# Patient Record
Sex: Female | Born: 1950 | Race: White | Hispanic: No | Marital: Married | Smoking: Never smoker
Health system: Southern US, Community
[De-identification: ages and names within clinical notes are randomized; demographics above are authoritative.]

## PROBLEM LIST (undated history)

## (undated) DIAGNOSIS — K5790 Diverticulosis of intestine, part unspecified, without perforation or abscess without bleeding: Secondary | ICD-10-CM

## (undated) DIAGNOSIS — I341 Nonrheumatic mitral (valve) prolapse: Secondary | ICD-10-CM

## (undated) DIAGNOSIS — R0989 Other specified symptoms and signs involving the circulatory and respiratory systems: Secondary | ICD-10-CM

## (undated) DIAGNOSIS — M810 Age-related osteoporosis without current pathological fracture: Secondary | ICD-10-CM

## (undated) DIAGNOSIS — N200 Calculus of kidney: Secondary | ICD-10-CM

## (undated) DIAGNOSIS — G4733 Obstructive sleep apnea (adult) (pediatric): Secondary | ICD-10-CM

## (undated) DIAGNOSIS — K589 Irritable bowel syndrome without diarrhea: Secondary | ICD-10-CM

## (undated) DIAGNOSIS — R0789 Other chest pain: Secondary | ICD-10-CM

## (undated) DIAGNOSIS — C55 Malignant neoplasm of uterus, part unspecified: Secondary | ICD-10-CM

## (undated) DIAGNOSIS — F419 Anxiety disorder, unspecified: Secondary | ICD-10-CM

## (undated) DIAGNOSIS — D72820 Lymphocytosis (symptomatic): Secondary | ICD-10-CM

## (undated) DIAGNOSIS — D649 Anemia, unspecified: Secondary | ICD-10-CM

## (undated) DIAGNOSIS — R112 Nausea with vomiting, unspecified: Secondary | ICD-10-CM

## (undated) DIAGNOSIS — N189 Chronic kidney disease, unspecified: Secondary | ICD-10-CM

## (undated) DIAGNOSIS — Z9889 Other specified postprocedural states: Secondary | ICD-10-CM

## (undated) DIAGNOSIS — E039 Hypothyroidism, unspecified: Secondary | ICD-10-CM

## (undated) DIAGNOSIS — E785 Hyperlipidemia, unspecified: Secondary | ICD-10-CM

## (undated) DIAGNOSIS — I38 Endocarditis, valve unspecified: Secondary | ICD-10-CM

## (undated) DIAGNOSIS — I10 Essential (primary) hypertension: Secondary | ICD-10-CM

## (undated) DIAGNOSIS — D369 Benign neoplasm, unspecified site: Secondary | ICD-10-CM

## (undated) DIAGNOSIS — Z8616 Personal history of COVID-19: Secondary | ICD-10-CM

## (undated) DIAGNOSIS — K649 Unspecified hemorrhoids: Secondary | ICD-10-CM

## (undated) DIAGNOSIS — C801 Malignant (primary) neoplasm, unspecified: Secondary | ICD-10-CM

## (undated) HISTORY — DX: Obstructive sleep apnea (adult) (pediatric): G47.33

## (undated) HISTORY — DX: Lymphocytosis (symptomatic): D72.820

## (undated) HISTORY — DX: Essential (primary) hypertension: I10

## (undated) HISTORY — PX: BREAST CYST ASPIRATION: SHX578

## (undated) HISTORY — PX: KNEE SURGERY: SHX244

## (undated) HISTORY — DX: Age-related osteoporosis without current pathological fracture: M81.0

## (undated) HISTORY — PX: THYROID SURGERY: SHX805

## (undated) HISTORY — DX: Nonrheumatic mitral (valve) prolapse: I34.1

## (undated) HISTORY — PX: JOINT REPLACEMENT: SHX530

## (undated) HISTORY — DX: Personal history of COVID-19: Z86.16

## (undated) HISTORY — PX: ABDOMINAL HYSTERECTOMY: SHX81

---

## 2005-02-02 ENCOUNTER — Emergency Department: Payer: Self-pay | Admitting: Emergency Medicine

## 2005-02-09 ENCOUNTER — Emergency Department: Payer: Self-pay | Admitting: Emergency Medicine

## 2005-02-20 ENCOUNTER — Ambulatory Visit: Payer: Self-pay | Admitting: Internal Medicine

## 2005-02-26 ENCOUNTER — Ambulatory Visit: Payer: Self-pay | Admitting: Internal Medicine

## 2005-03-18 ENCOUNTER — Ambulatory Visit: Payer: Self-pay | Admitting: Unknown Physician Specialty

## 2005-03-31 ENCOUNTER — Ambulatory Visit: Payer: Self-pay | Admitting: Unknown Physician Specialty

## 2005-08-14 ENCOUNTER — Ambulatory Visit: Payer: Self-pay | Admitting: Unknown Physician Specialty

## 2005-08-24 ENCOUNTER — Emergency Department: Payer: Self-pay | Admitting: Internal Medicine

## 2006-04-27 ENCOUNTER — Ambulatory Visit: Payer: Self-pay

## 2006-09-01 ENCOUNTER — Ambulatory Visit: Payer: Self-pay | Admitting: Unknown Physician Specialty

## 2007-10-26 ENCOUNTER — Ambulatory Visit: Payer: Self-pay | Admitting: Unknown Physician Specialty

## 2008-01-03 ENCOUNTER — Ambulatory Visit: Payer: Self-pay | Admitting: General Practice

## 2008-01-06 ENCOUNTER — Other Ambulatory Visit: Payer: Self-pay

## 2008-01-06 ENCOUNTER — Ambulatory Visit: Payer: Self-pay | Admitting: General Practice

## 2008-01-17 ENCOUNTER — Inpatient Hospital Stay: Payer: Self-pay | Admitting: General Practice

## 2008-11-01 ENCOUNTER — Ambulatory Visit: Payer: Self-pay | Admitting: Unknown Physician Specialty

## 2009-11-14 ENCOUNTER — Ambulatory Visit: Payer: Self-pay | Admitting: Unknown Physician Specialty

## 2010-11-19 ENCOUNTER — Ambulatory Visit: Payer: Self-pay | Admitting: Internal Medicine

## 2010-12-19 ENCOUNTER — Ambulatory Visit: Payer: Self-pay | Admitting: Unknown Physician Specialty

## 2011-02-13 ENCOUNTER — Ambulatory Visit: Payer: Self-pay | Admitting: General Practice

## 2011-04-07 ENCOUNTER — Inpatient Hospital Stay: Payer: Self-pay | Admitting: General Practice

## 2012-02-12 ENCOUNTER — Ambulatory Visit: Payer: Self-pay | Admitting: Unknown Physician Specialty

## 2012-06-24 ENCOUNTER — Ambulatory Visit: Payer: Self-pay | Admitting: Unknown Physician Specialty

## 2014-04-29 ENCOUNTER — Emergency Department: Payer: Self-pay | Admitting: Emergency Medicine

## 2014-05-22 DIAGNOSIS — S62629A Displaced fracture of medial phalanx of unspecified finger, initial encounter for closed fracture: Secondary | ICD-10-CM | POA: Insufficient documentation

## 2014-07-18 DIAGNOSIS — M1990 Unspecified osteoarthritis, unspecified site: Secondary | ICD-10-CM | POA: Insufficient documentation

## 2014-07-18 DIAGNOSIS — M810 Age-related osteoporosis without current pathological fracture: Secondary | ICD-10-CM | POA: Insufficient documentation

## 2015-04-06 DIAGNOSIS — M5137 Other intervertebral disc degeneration, lumbosacral region: Secondary | ICD-10-CM | POA: Insufficient documentation

## 2015-06-22 DIAGNOSIS — R0989 Other specified symptoms and signs involving the circulatory and respiratory systems: Secondary | ICD-10-CM | POA: Insufficient documentation

## 2015-06-22 DIAGNOSIS — I341 Nonrheumatic mitral (valve) prolapse: Secondary | ICD-10-CM | POA: Insufficient documentation

## 2015-07-26 DIAGNOSIS — E785 Hyperlipidemia, unspecified: Secondary | ICD-10-CM | POA: Insufficient documentation

## 2015-10-09 ENCOUNTER — Other Ambulatory Visit: Payer: Self-pay | Admitting: Unknown Physician Specialty

## 2015-10-09 DIAGNOSIS — Z13820 Encounter for screening for osteoporosis: Secondary | ICD-10-CM

## 2015-10-22 ENCOUNTER — Ambulatory Visit
Admission: RE | Admit: 2015-10-22 | Discharge: 2015-10-22 | Disposition: A | Discharge status: Home / Self Care | Payer: Medicare Other | Source: Ambulatory Visit | Attending: Unknown Physician Specialty | Admitting: Unknown Physician Specialty

## 2015-10-22 DIAGNOSIS — Z13820 Encounter for screening for osteoporosis: Secondary | ICD-10-CM | POA: Diagnosis not present

## 2015-10-22 DIAGNOSIS — Z780 Asymptomatic menopausal state: Secondary | ICD-10-CM | POA: Insufficient documentation

## 2017-05-20 ENCOUNTER — Encounter: Payer: Self-pay | Admitting: Obstetrics & Gynecology

## 2017-05-20 ENCOUNTER — Ambulatory Visit (INDEPENDENT_AMBULATORY_CARE_PROVIDER_SITE_OTHER): Payer: Medicare Other | Admitting: Obstetrics & Gynecology

## 2017-05-20 VITALS — BP 110/60 | HR 79 | Ht 61.0 in | Wt 165.0 lb

## 2017-05-20 DIAGNOSIS — Z0000 Encounter for general adult medical examination without abnormal findings: Secondary | ICD-10-CM

## 2017-05-20 DIAGNOSIS — Z1211 Encounter for screening for malignant neoplasm of colon: Secondary | ICD-10-CM

## 2017-05-20 DIAGNOSIS — Z01419 Encounter for gynecological examination (general) (routine) without abnormal findings: Secondary | ICD-10-CM | POA: Diagnosis not present

## 2017-05-20 DIAGNOSIS — Z1239 Encounter for other screening for malignant neoplasm of breast: Secondary | ICD-10-CM

## 2017-05-20 DIAGNOSIS — M810 Age-related osteoporosis without current pathological fracture: Secondary | ICD-10-CM

## 2017-05-20 DIAGNOSIS — N393 Stress incontinence (female) (male): Secondary | ICD-10-CM | POA: Diagnosis not present

## 2017-05-20 DIAGNOSIS — Z1231 Encounter for screening mammogram for malignant neoplasm of breast: Secondary | ICD-10-CM

## 2017-05-20 MED ORDER — RALOXIFENE HCL 60 MG PO TABS: 60.0000 mg | ORAL_TABLET | Freq: Every day | ORAL | 12 refills | Status: DC

## 2017-05-20 NOTE — Progress Notes (Signed)
  HPI:      [name] is a 66 y.o. [id number] who LMP was in the past, she presents today for her annual examination.  The patient has no complaints today. The patient is not sexually active. Herlast pap: approximate date 2017 and was normal and last mammogram: approximate date 2017 and was normal.  The patient does perform self breast exams.  There is no notable family history of breast or ovarian cancer in her family. The patient is not taking hormone replacement therapy. Patient denies post-menopausal vaginal bleeding.   The patient has regular exercise: yes. The patient denies current symptoms of depression.  GSI esp at night, no urge or freq.  GYN Hx: Last Colonoscopy:4 years ago. Normal.  Last DEXA: 2 years ago.    PMHx: History reviewed. No pertinent past medical history. Past Surgical History:  Procedure Laterality Date  . ABDOMINAL HYSTERECTOMY    . KNEE SURGERY    . THYROID SURGERY     Family History  Problem Relation Age of Onset  . Skin cancer Mother   . Cancer Maternal Grandfather    Social History  Substance Use Topics  . Smoking status: Never Smoker  . Smokeless tobacco: Never Used  . Alcohol use No    Current Outpatient Prescriptions:  .  raloxifene (EVISTA) 60 MG tablet, Take 1 tablet (60 mg total) by mouth daily., Disp: 30 tablet, Rfl: 12 Allergies: Bisphosphonates and Celecoxib  ROS  Objective: BP 110/60   Pulse 79   Ht 5\' 1"  (1.549 m)   Wt 165 lb (74.8 kg)   BMI 31.18 kg/m   Filed Weights   05/20/17 1329  Weight: 165 lb (74.8 kg)   Body mass index is 31.18 kg/m. OBGyn Exam  Assessment: Annual Exam 1. Annual physical exam   2. Screening for breast cancer   3. Screen for colon cancer   4. Stress incontinence of urine   5. Age-related osteoporosis without current pathological fracture     Plan:            1.  Cervical Screening-  Pap smear schedule reviewed with patient  2. Breast screening- Exam annually and mammogram  scheduled  3. Colonoscopy every 10 years, Hemoccult testing after age 22  4. Labs managed by PCP  5. Counseling for hormonal therapy: none.  Start Evista for OP after all options discussed.  Tried bisphos in past w SE.  Does not want hormones.   6. Stress incontinence of urine Monitor for now   7. Age-related osteoporosis without current pathological fracture - raloxifene (EVISTA) 60 MG tablet; Take 1 tablet (60 mg total) by mouth daily.  Dispense: 30 tablet; Refill: 12 - also vit D and calcium as she has been doing    F/U  Return in about 1 year (around 05/20/2018) for Annual.  Barnett Applebaum, MD, [facility], [facility] 05/20/2017  1:52 PM

## 2017-05-20 NOTE — Patient Instructions (Addendum)
PAP every three years Mammogram every year.  [place] [facility] [phone]. Colonoscopy every 10 years Labs yearly (with PCP) Bone scan after effective therapy   Raloxifene tablets What is this medicine? RALOXIFENE (ral OX i feen) reduces the amount of calcium lost from bones. It is used to treat and prevent osteoporosis in women who have experienced menopause. It may also help prevent invasive breast cancer in certain women who have a high risk for breast cancer. This medicine may be used for other purposes; ask your health care provider or pharmacist if you have questions. COMMON BRAND NAME(S): Evista What should I tell my health care provider before I take this medicine? They need to know if you have any of these conditions: -a history of blood clots -cancer -heart disease or recent heart attack -high levels of triglycerides (blood fat) in the blood -history of stroke -kidney disease -liver disease -premenopausal -smoke tobacco -an unusual or allergic reaction to raloxifene, other medicines, foods, dyes, or preservatives -pregnant or trying to get pregnant -breast-feeding How should I use this medicine? Take this medicine by mouth with a glass of water. Follow the directions on the prescription label. The tablets can be taken with or without food. Take your doses at regular intervals. Do not take your medicine more often than directed. A special MedGuide will be given to you by the pharmacist with each prescription and refill. Be sure to read this information carefully each time. Talk to your pediatrician regarding the use of this medicine in children. Special care may be needed. Overdosage: If you think you have taken too much of this medicine contact a poison control center or emergency room at once. NOTE: This medicine is only for you. Do not share this medicine with others. What if I miss a dose? If you miss a dose, take it as soon as you can. If it is almost time for your next dose, take  only that dose. Do not take double or extra doses. What may interact with this medicine? -cholestyramine -female hormones, like estrogens -warfarin This list may not describe all possible interactions. Give your health care provider a list of all the medicines, herbs, non-prescription drugs, or dietary supplements you use. Also tell them if you smoke, drink alcohol, or use illegal drugs. Some items may interact with your medicine. What should I watch for while using this medicine? Visit your doctor or health care professional for regular checks on your progress. Do not stop taking this medicine except on the advice of your doctor or health care professional. If you are taking this medicine to reduce your risk of getting breast cancer, you should know that this medicine does not prevent all types of breast cancer. Talk to your doctor if you have questions. This medicine does not prevent hot flashes. It may cause hot flashes in some patients at the start of therapy. You should make sure that you get enough calcium and vitamin D while you are taking this medicine. Discuss the foods you eat and the vitamins you take with your health care professional. Exercise may help to prevent bone loss. Discuss your exercise needs with your doctor or health care professional. This medicine can rarely cause blood clots. If you are going to have surgery, tell your doctor or health care professional that you are taking this medicine. This medicine should be stopped at least 3 days before surgery. After surgery, it should be restarted only after you are walking again. It should not be restarted while you  still need long periods of bed rest. You should not smoke while taking this medicine. Smoking may increase your risk of blood clots or stroke. If you have any reason to think you are pregnant; stop taking this medicine at once and contact your doctor or health care professional. Do not breast feed while taking this  medicine. What side effects may I notice from receiving this medicine? Side effects that you should report to your doctor or health care professional as soon as possible: -allergic reactions like skin rash, itching or hives, swelling of the face, lips, or tongue) -breast tissue changes or discharge -signs and symptoms of a blood clot such as breathing problems; changes in vision; chest pain; severe, sudden headache; pain, swelling, warmth in the leg; trouble speaking; sudden numbness or weakness of the face, arm or leg -signs and symptoms of a stroke like changes in vision; confusion; trouble speaking or understanding; severe headaches; sudden numbness or weakness of the face, arm or leg; trouble walking; dizziness; loss of balance or coordination -vaginal discharge that is bloody, brown, or rust Side effects that usually do not require medical attention (report to your doctor or health care professional if they continue or are bothersome): -hot flashes -joint pain -leg cramps -sweating -swelling of the ankles, feet, hands This list may not describe all possible side effects. Call your doctor for medical advice about side effects. You may report side effects to FDA at 1-800-FDA-1088. Where should I keep my medicine? Keep out of the reach of children. Store at room temperature between 15 and 30 degrees C (59 and 86 degrees F). Throw away any unused medicine after the expiration date. NOTE: This sheet is a summary. It may not cover all possible information. If you have questions about this medicine, talk to your doctor, pharmacist, or health care provider.  2018 Elsevier/Gold Standard (2017-01-07 17:15:34)

## 2017-07-01 ENCOUNTER — Ambulatory Visit
Admission: RE | Admit: 2017-07-01 | Discharge: 2017-07-01 | Disposition: A | Discharge status: Home / Self Care | Payer: Medicare Other | Source: Ambulatory Visit | Attending: Obstetrics & Gynecology | Admitting: Obstetrics & Gynecology

## 2017-07-01 DIAGNOSIS — N6489 Other specified disorders of breast: Secondary | ICD-10-CM | POA: Diagnosis not present

## 2017-07-01 DIAGNOSIS — Z1231 Encounter for screening mammogram for malignant neoplasm of breast: Secondary | ICD-10-CM | POA: Insufficient documentation

## 2017-07-01 DIAGNOSIS — R928 Other abnormal and inconclusive findings on diagnostic imaging of breast: Secondary | ICD-10-CM | POA: Diagnosis not present

## 2017-07-01 DIAGNOSIS — Z1239 Encounter for other screening for malignant neoplasm of breast: Secondary | ICD-10-CM

## 2017-07-03 ENCOUNTER — Inpatient Hospital Stay
Admission: RE | Admit: 2017-07-03 | Discharge: 2017-07-03 | Disposition: A | Discharge status: Home / Self Care | Payer: Self-pay | Source: Ambulatory Visit | Attending: *Deleted | Admitting: *Deleted

## 2017-07-03 ENCOUNTER — Other Ambulatory Visit: Payer: Self-pay | Admitting: *Deleted

## 2017-07-03 ENCOUNTER — Other Ambulatory Visit: Payer: Self-pay | Admitting: Obstetrics & Gynecology

## 2017-07-03 DIAGNOSIS — R928 Other abnormal and inconclusive findings on diagnostic imaging of breast: Secondary | ICD-10-CM

## 2017-07-03 DIAGNOSIS — Z9289 Personal history of other medical treatment: Secondary | ICD-10-CM

## 2017-07-07 ENCOUNTER — Encounter: Payer: Self-pay | Admitting: Obstetrics & Gynecology

## 2017-07-10 ENCOUNTER — Ambulatory Visit
Admission: RE | Admit: 2017-07-10 | Discharge: 2017-07-10 | Disposition: A | Discharge status: Home / Self Care | Payer: Medicare Other | Source: Ambulatory Visit | Attending: Obstetrics & Gynecology | Admitting: Obstetrics & Gynecology

## 2017-07-10 DIAGNOSIS — R928 Other abnormal and inconclusive findings on diagnostic imaging of breast: Secondary | ICD-10-CM

## 2018-02-20 DIAGNOSIS — I5189 Other ill-defined heart diseases: Secondary | ICD-10-CM

## 2018-02-20 HISTORY — DX: Other ill-defined heart diseases: I51.89

## 2018-08-04 DIAGNOSIS — R0789 Other chest pain: Secondary | ICD-10-CM | POA: Insufficient documentation

## 2019-05-20 ENCOUNTER — Ambulatory Visit: Payer: Medicare Other | Admitting: Obstetrics & Gynecology

## 2019-05-30 ENCOUNTER — Ambulatory Visit: Payer: Medicare Other | Admitting: Obstetrics & Gynecology

## 2019-07-12 ENCOUNTER — Other Ambulatory Visit: Payer: Self-pay | Admitting: Internal Medicine

## 2019-07-12 DIAGNOSIS — Z1231 Encounter for screening mammogram for malignant neoplasm of breast: Secondary | ICD-10-CM

## 2019-07-14 ENCOUNTER — Ambulatory Visit: Payer: Medicare Other | Admitting: Obstetrics & Gynecology

## 2019-08-15 ENCOUNTER — Ambulatory Visit
Admission: RE | Admit: 2019-08-15 | Discharge: 2019-08-15 | Disposition: A | Discharge status: Home / Self Care | Payer: Medicare Other | Source: Ambulatory Visit | Attending: Internal Medicine | Admitting: Internal Medicine

## 2019-08-15 DIAGNOSIS — Z1231 Encounter for screening mammogram for malignant neoplasm of breast: Secondary | ICD-10-CM | POA: Diagnosis not present

## 2019-08-31 DIAGNOSIS — G4733 Obstructive sleep apnea (adult) (pediatric): Secondary | ICD-10-CM | POA: Insufficient documentation

## 2019-09-22 ENCOUNTER — Other Ambulatory Visit: Payer: Self-pay

## 2019-09-22 ENCOUNTER — Emergency Department: Payer: Medicare Other

## 2019-09-22 ENCOUNTER — Emergency Department
Admission: EM | Admit: 2019-09-22 | Discharge: 2019-09-22 | Disposition: A | Discharge status: Home / Self Care | Payer: Medicare Other | Attending: Emergency Medicine | Admitting: Emergency Medicine

## 2019-09-22 DIAGNOSIS — S42252A Displaced fracture of greater tuberosity of left humerus, initial encounter for closed fracture: Secondary | ICD-10-CM | POA: Insufficient documentation

## 2019-09-22 DIAGNOSIS — Y999 Unspecified external cause status: Secondary | ICD-10-CM | POA: Diagnosis not present

## 2019-09-22 DIAGNOSIS — Y939 Activity, unspecified: Secondary | ICD-10-CM | POA: Diagnosis not present

## 2019-09-22 DIAGNOSIS — Y929 Unspecified place or not applicable: Secondary | ICD-10-CM | POA: Insufficient documentation

## 2019-09-22 DIAGNOSIS — S4992XA Unspecified injury of left shoulder and upper arm, initial encounter: Secondary | ICD-10-CM | POA: Diagnosis present

## 2019-09-22 DIAGNOSIS — S4292XA Fracture of left shoulder girdle, part unspecified, initial encounter for closed fracture: Secondary | ICD-10-CM

## 2019-09-22 DIAGNOSIS — W010XXA Fall on same level from slipping, tripping and stumbling without subsequent striking against object, initial encounter: Secondary | ICD-10-CM | POA: Insufficient documentation

## 2019-09-22 DIAGNOSIS — Z79899 Other long term (current) drug therapy: Secondary | ICD-10-CM | POA: Diagnosis not present

## 2019-09-22 MED ORDER — ONDANSETRON HCL 4 MG/2ML IJ SOLN
4.0000 mg | Freq: Once | INTRAMUSCULAR | Status: AC
  Administered 2019-09-22: 4 mg via INTRAVENOUS
  Filled 2019-09-22: qty 2

## 2019-09-22 MED ORDER — MORPHINE SULFATE (PF) 4 MG/ML IV SOLN
4.0000 mg | Freq: Once | INTRAVENOUS | Status: AC
  Administered 2019-09-22: 10:00:00 4 mg via INTRAVENOUS
  Filled 2019-09-22: qty 1

## 2019-09-22 MED ORDER — OXYCODONE-ACETAMINOPHEN 5-325 MG PO TABS: 1.0000 | ORAL_TABLET | ORAL | 0 refills | Status: AC | PRN

## 2019-09-22 NOTE — ED Provider Notes (Signed)
[facility] Emergency Department Provider Note   ____________________________________________   First MD Initiated Contact with Patient 09/22/19 1003     (approximate)  I have reviewed the triage vital signs and the nursing notes.   HISTORY  Chief Complaint Shoulder Pain    HPI Denise [facility] is a 68 y.o. female who reports she tripped and fell landing on her left shoulder.  She did not hit anything else.  She did not pass out.  She did not hit her head.  She has no headache or neck pain or pain anywhere except for her left shoulder.         History reviewed. No pertinent past medical history. Past medical history significant for the leaky mitral and tricuspid valve. Patient Active Problem List   Diagnosis Date Noted  . Stress incontinence of urine 05/20/2017  . Age-related osteoporosis without current pathological fracture 05/20/2017    Past Surgical History:  Procedure Laterality Date  . ABDOMINAL HYSTERECTOMY    . KNEE SURGERY    . THYROID SURGERY      Prior to Admission medications   Medication Sig Start Date End Date Taking? Authorizing Provider  ALPRAZolam Duanne Moron) 0.5 MG tablet Take 0.5 mg by mouth 3 (three) times daily as needed for anxiety. 09/16/19  Yes [provider]  levothyroxine (SYNTHROID) 88 MCG tablet Take 88 mcg by mouth daily. Take on an empty stomach with a glass of water at least 30-60 minutes before breakfast 06/30/19  Yes [provider]  nitroGLYCERIN (NITROSTAT) 0.4 MG SL tablet Place 0.4 mg under the tongue as needed. 08/31/19 08/30/20 Yes [provider]  raloxifene (EVISTA) 60 MG tablet Take 1 tablet (60 mg total) by mouth daily. 05/20/17   Gae Dry, MD    Allergies Bisphosphonates, Celecoxib, and Nsaids  Family History  Problem Relation Age of Onset  . Skin cancer Mother   . Cancer Maternal Grandfather     Social History Social History   Tobacco Use  . Smoking status:  Never Smoker  . Smokeless tobacco: Never Used  Substance Use Topics  . Alcohol use: No  . Drug use: No    Review of Systems  Constitutional: No fever/chills Eyes: No visual changes. ENT: No sore throat. Cardiovascular: Denies chest pain. Respiratory: Denies shortness of breath. Gastrointestinal: No abdominal pain.  No nausea, no vomiting.  No diarrhea.  No constipation. Genitourinary: Negative for dysuria. Musculoskeletal: Negative for back pain. Skin: Negative for rash. Neurological: Negative for headaches, focal weakness   ____________________________________________   PHYSICAL EXAM:  VITAL SIGNS: ED Triage Vitals  Enc Vitals Group     BP      Pulse      Resp      Temp      Temp src      SpO2      Weight      Height      Head Circumference      Peak Flow      Pain Score      Pain Loc      Pain Edu?      Excl. in [place]?     Constitutional: Alert and oriented. Well appearing and in no acute distress. Eyes: Conjunctivae are normal.  Head: Atraumatic. Nose: No congestion/rhinnorhea. Mouth/Throat: Mucous membranes are moist.  Oropharynx non-erythematous. Neck: No stridor.  No cervical spine tenderness to palpation. Cardiovascular: Normal rate, regular rhythm. Grossly normal heart sounds.  I do not hear her heart  murmur.  Good peripheral circulation. Respiratory: Normal respiratory effort.  No retractions. Lungs CTAB. Gastrointestinal: Soft and nontender. No distention. No abdominal bruits. No CVA tenderness. Musculoskeletal: No lower extremity tenderness left shoulder is tender.  There is no other tenderness in the back around the clavicle or distal arm. Neurologic:  Normal speech and language. No gross focal neurologic deficits are appreciated. No gait instability. Skin:  Skin is warm, dry and intact. No rash noted. Psychiatric: Mood and affect are normal. Speech and behavior are normal.  ____________________________________________   LABS (all labs ordered are  listed, but only abnormal results are displayed)  Labs Reviewed - No data to display ____________________________________________  EKG   ____________________________________________  RADIOLOGY  ED MD interpretation: Unfortunately I am unable to open all the views of the x-ray on the new PACS viewer for some reason.  The views I can open and do not appear to show anything but the greater tuberosity fracture fragments.  The joint appears to be intact.  Official radiology report(s): Dg Shoulder Left  Result Date: 09/22/2019 CLINICAL DATA:  Fall, pain EXAM: LEFT SHOULDER - 2+ VIEW COMPARISON:  None. FINDINGS: There are mildly displaced fracture fragments of the greater tuberosity of the left humerus. The remainder of the proximal humerus appears intact. The glenohumeral joint is in anatomic apposition. The acromioclavicular joint is preserved. The partially imaged left chest is unremarkable. IMPRESSION: 1. There are mildly displaced fracture fragments of the greater tuberosity of the left humerus. 2.  The glenohumeral joint is in anatomic apposition. Electronically Signed   By: Eddie Candle M.D.   On: 09/22/2019 10:59    ____________________________________________   PROCEDURES  Procedure(s) performed (including Critical Care):  Procedures   ____________________________________________   INITIAL IMPRESSION / ASSESSMENT AND PLAN / ED COURSE  ALEYNAH BLUMBERG was evaluated in Emergency Department on 09/22/2019 for the symptoms described in the history of present illness. She was evaluated in the context of the global COVID-19 pandemic, which necessitated consideration that the patient might be at risk for infection with the SARS-CoV-2 virus that causes COVID-19. Institutional protocols and algorithms that pertain to the evaluation of patients at risk for COVID-19 are in a state of rapid change based on information released by regulatory bodies including the CDC and federal and state  organizations. These policies and algorithms were followed during the patient's care in the ED. Dr. Mack Guise reviewed the films and feels that, as I suspected, all we need to do is put her in a sling and he will follow her up in the office.             ____________________________________________   FINAL CLINICAL IMPRESSION(S) / ED DIAGNOSES  Final diagnoses:  Closed fracture of left shoulder, initial encounter     ED Discharge Orders    None       Note:  This document was prepared using Dragon voice recognition software and may include unintentional dictation errors.    Nena Polio, MD 09/22/19 1114

## 2019-09-22 NOTE — ED Triage Notes (Signed)
Pt arrives via EMS from home after having a mechanical fall- no LOC per pt- pt states the only thing bothering her is her left shoulder- states she is unable to raise it

## 2019-09-22 NOTE — Discharge Instructions (Addendum)
The x-ray shows that you broke part of the top of the humerus or the upper arm bone.  I spoke with the orthopedic surgeon, Dr. Mack Guise.  He wants to keep you in a sling and follow-up with you in the office.  This will give the inflammation from the fall chance to settle down.  Please give his office a call.  Let them know you were seen in the emergency room with a shoulder fracture and that Dr. Mack Guise wants to follow-up with you in the office.  They should be out of see you within about a week.  Wear the sling.  You can take it off at night.  Use the Percocet 1 pill 4 times a day as needed for pain.  Be careful the Percocet can make you constipated and can make you woozy.  Do not to fall.  Do not drive on the Percocet.  Return for any increasing pain numbness or other problems.

## 2019-09-22 NOTE — ED Notes (Signed)
E-sig pad unavailable at this time, d/c teaching provided and paperwork explained, pt and husband verbalize understanding of d/c teaching

## 2020-01-06 ENCOUNTER — Ambulatory Visit: Payer: Medicare Other | Attending: Specialist

## 2020-01-06 DIAGNOSIS — G4733 Obstructive sleep apnea (adult) (pediatric): Secondary | ICD-10-CM | POA: Insufficient documentation

## 2020-01-09 ENCOUNTER — Other Ambulatory Visit: Payer: Self-pay

## 2020-01-27 ENCOUNTER — Other Ambulatory Visit: Admission: RE | Admit: 2020-01-27 | Payer: Medicare Other | Source: Ambulatory Visit

## 2020-01-31 ENCOUNTER — Ambulatory Visit: Payer: Medicare Other

## 2020-02-07 ENCOUNTER — Ambulatory Visit: Payer: Medicare Other | Attending: Specialist

## 2020-02-07 DIAGNOSIS — G4733 Obstructive sleep apnea (adult) (pediatric): Secondary | ICD-10-CM | POA: Insufficient documentation

## 2020-02-08 ENCOUNTER — Other Ambulatory Visit: Payer: Self-pay

## 2020-07-05 ENCOUNTER — Other Ambulatory Visit: Payer: Self-pay | Admitting: Internal Medicine

## 2020-07-05 DIAGNOSIS — Z1231 Encounter for screening mammogram for malignant neoplasm of breast: Secondary | ICD-10-CM

## 2020-08-15 ENCOUNTER — Ambulatory Visit
Admission: RE | Admit: 2020-08-15 | Discharge: 2020-08-15 | Disposition: A | Discharge status: Home / Self Care | Payer: Medicare Other | Source: Ambulatory Visit | Attending: Internal Medicine | Admitting: Internal Medicine

## 2020-08-15 ENCOUNTER — Other Ambulatory Visit: Payer: Self-pay

## 2020-08-15 DIAGNOSIS — Z1231 Encounter for screening mammogram for malignant neoplasm of breast: Secondary | ICD-10-CM | POA: Diagnosis not present

## 2020-12-19 DIAGNOSIS — Z8616 Personal history of COVID-19: Secondary | ICD-10-CM

## 2020-12-19 HISTORY — DX: Personal history of COVID-19: Z86.16

## 2021-03-07 ENCOUNTER — Other Ambulatory Visit: Payer: Self-pay | Admitting: *Deleted

## 2021-03-07 ENCOUNTER — Encounter: Payer: Self-pay | Admitting: *Deleted

## 2021-03-08 ENCOUNTER — Encounter (INDEPENDENT_AMBULATORY_CARE_PROVIDER_SITE_OTHER): Payer: Self-pay

## 2021-03-08 ENCOUNTER — Inpatient Hospital Stay: Payer: Medicare Other

## 2021-03-08 ENCOUNTER — Inpatient Hospital Stay: Payer: Medicare Other | Attending: Internal Medicine | Admitting: Internal Medicine

## 2021-03-08 ENCOUNTER — Encounter: Payer: Self-pay | Admitting: Internal Medicine

## 2021-03-08 DIAGNOSIS — Z8616 Personal history of COVID-19: Secondary | ICD-10-CM | POA: Diagnosis not present

## 2021-03-08 DIAGNOSIS — G4733 Obstructive sleep apnea (adult) (pediatric): Secondary | ICD-10-CM | POA: Diagnosis not present

## 2021-03-08 DIAGNOSIS — Z79899 Other long term (current) drug therapy: Secondary | ICD-10-CM | POA: Insufficient documentation

## 2021-03-08 DIAGNOSIS — I10 Essential (primary) hypertension: Secondary | ICD-10-CM | POA: Insufficient documentation

## 2021-03-08 DIAGNOSIS — D72820 Lymphocytosis (symptomatic): Secondary | ICD-10-CM | POA: Insufficient documentation

## 2021-03-08 LAB — CBC WITH DIFFERENTIAL/PLATELET
Abs Immature Granulocytes: 0.01 10*3/uL (ref 0.00–0.07)
Basophils Absolute: 0.1 10*3/uL (ref 0.0–0.1)
Basophils Relative: 1 %
Eosinophils Absolute: 0.1 10*3/uL (ref 0.0–0.5)
Eosinophils Relative: 1 %
HCT: 43.7 % (ref 36.0–46.0)
Hemoglobin: 14.5 g/dL (ref 12.0–15.0)
Immature Granulocytes: 0 %
Lymphocytes Relative: 44 %
Lymphs Abs: 2.6 10*3/uL (ref 0.7–4.0)
MCH: 29.2 pg (ref 26.0–34.0)
MCHC: 33.2 g/dL (ref 30.0–36.0)
MCV: 87.9 fL (ref 80.0–100.0)
Monocytes Absolute: 0.5 10*3/uL (ref 0.1–1.0)
Monocytes Relative: 9 %
Neutro Abs: 2.6 10*3/uL (ref 1.7–7.7)
Neutrophils Relative %: 45 %
Platelets: 301 10*3/uL (ref 150–400)
RBC: 4.97 MIL/uL (ref 3.87–5.11)
RDW: 13.2 % (ref 11.5–15.5)
WBC: 5.8 10*3/uL (ref 4.0–10.5)
nRBC: 0 % (ref 0.0–0.2)

## 2021-03-08 LAB — TECHNOLOGIST SMEAR REVIEW: Tech Review: NORMAL

## 2021-03-08 LAB — LACTATE DEHYDROGENASE: LDH: 156 U/L (ref 98–192)

## 2021-03-08 NOTE — Progress Notes (Signed)
[facility] CONSULT NOTE  Patient Care Team: Tracie Harrier, MD as PCP - General (Internal Medicine)  CHIEF COMPLAINTS/PURPOSE OF CONSULTATION: Lymphocytosis  #  Oncology History   No history exists.     HISTORY OF PRESENTING ILLNESS:  Denise [facility] 70 y.o.  female with no prior history of malignancy has been referred to [place] for further evaluation recommendations for mild lymphocytosis.  Patient noted to have elevated lymphocyte count intermittently over the last 2 to 3 years.  Relative lymphocytosis; but no absolute lymphocytosis.  Normal hemoglobin normal white count.  Patient denies any loss of appetite.  Denies any nausea vomiting.  Complains of fatigue [she had been taking care of her parents].   Review of Systems  Constitutional: Positive for malaise/fatigue. Negative for chills, diaphoresis, fever and weight loss.  HENT: Negative for nosebleeds and sore throat.   Eyes: Negative for double vision.  Respiratory: Negative for cough, hemoptysis, sputum production, shortness of breath and wheezing.   Cardiovascular: Negative for chest pain, palpitations, orthopnea and leg swelling.  Gastrointestinal: Negative for abdominal pain, blood in stool, constipation, diarrhea, heartburn, melena, nausea and vomiting.  Genitourinary: Negative for dysuria, frequency and urgency.  Musculoskeletal: Positive for joint pain. Negative for back pain.  Skin: Negative.  Negative for itching and rash.  Neurological: Negative for dizziness, tingling, focal weakness, weakness and headaches.  Endo/Heme/Allergies: Does not bruise/bleed easily.  Psychiatric/Behavioral: Negative for depression. The patient is not nervous/anxious and does not have insomnia.      MEDICAL HISTORY:  Past Medical History:  Diagnosis Date  . History of COVID-19   . Lymphocytosis   . Mitral valve prolapse syndrome   . OSA on CPAP   . Osteoporosis   . Primary hypertension     SURGICAL HISTORY: Past  Surgical History:  Procedure Laterality Date  . ABDOMINAL HYSTERECTOMY    . BREAST CYST ASPIRATION     years ago  . KNEE SURGERY    . THYROID SURGERY      SOCIAL HISTORY: Social History   Socioeconomic History  . Marital status: Married    Spouse name: Not on file  . Number of children: Not on file  . Years of education: Not on file  . Highest education level: Not on file  Occupational History  . Not on file  Tobacco Use  . Smoking status: Never Smoker  . Smokeless tobacco: Never Used  Vaping Use  . Vaping Use: Never used  Substance and Sexual Activity  . Alcohol use: No  . Drug use: No  . Sexual activity: Never  Other Topics Concern  . Not on file  Social History Narrative   Retired Marine scientist.  No smoking, alcohol abuse.  She had been taking care of her parents.  Parents recently moved to assisted living   Social Determinants of Health   Financial Resource Strain: Not on file  Food Insecurity: Not on file  Transportation Needs: Not on file  Physical Activity: Not on file  Stress: Not on file  Social Connections: Not on file  Intimate Partner Violence: Not on file    FAMILY HISTORY: Family History  Problem Relation Age of Onset  . Skin cancer Mother   . Cancer Maternal Grandfather   . Breast cancer Neg Hx     ALLERGIES:  is allergic to amitriptyline, bisphosphonates, celecoxib, and nsaids.  MEDICATIONS:  Current Outpatient Medications  Medication Sig Dispense Refill  . ALPRAZolam (XANAX) 0.5 MG tablet Take 0.5 mg by mouth 3 (  three) times daily as needed for anxiety.    Marland Kitchen azelastine (ASTELIN) 0.1 % nasal spray     . Calcium Carbonate-Vitamin D 600-200 MG-UNIT TABS Take 1 tablet by mouth in the morning and at bedtime.    . clotrimazole-betamethasone (LOTRISONE) cream     . hydrochlorothiazide (HYDRODIURIL) 25 MG tablet     . levothyroxine (SYNTHROID) 88 MCG tablet Take 88 mcg by mouth daily. Take on an empty stomach with a glass of water at least 30-60 minutes  before breakfast    . nitroGLYCERIN (NITROSTAT) 0.4 MG SL tablet Place 0.4 mg under the tongue as needed.    Marland Kitchen telmisartan (MICARDIS) 40 MG tablet Take 1 tablet by mouth daily.    Marland Kitchen venlafaxine XR (EFFEXOR-XR) 37.5 MG 24 hr capsule Take 1 capsule by mouth daily.     No current facility-administered medications for this visit.      Marland Kitchen  PHYSICAL EXAMINATION: ECOG PERFORMANCE STATUS: 0 - Asymptomatic  Vitals:   03/08/21 1339  BP: (!) 144/88  Pulse: 80  Resp: 16  Temp: (!) 97.5 F (36.4 C)   Filed Weights   03/08/21 1339  Weight: 173 lb (78.5 kg)    Physical Exam Constitutional:      Comments: Patient is walk independently.  She is alone.  HENT:     Head: Normocephalic and atraumatic.     Mouth/Throat:     Pharynx: No oropharyngeal exudate.  Eyes:     Pupils: Pupils are equal, round, and reactive to light.  Cardiovascular:     Rate and Rhythm: Normal rate and regular rhythm.  Pulmonary:     Effort: Pulmonary effort is normal. No respiratory distress.     Breath sounds: Normal breath sounds. No wheezing.  Abdominal:     General: Bowel sounds are normal. There is no distension.     Palpations: Abdomen is soft. There is no mass.     Tenderness: There is no abdominal tenderness. There is no guarding or rebound.  Musculoskeletal:        General: No tenderness. Normal range of motion.     Cervical back: Normal range of motion and neck supple.  Skin:    General: Skin is warm.  Neurological:     Mental Status: She is alert and oriented to person, place, and time.  Psychiatric:        Mood and Affect: Affect normal.      LABORATORY DATA:  I have reviewed the data as listed Lab Results  Component Value Date   WBC 5.8 03/08/2021   HGB 14.5 03/08/2021   HCT 43.7 03/08/2021   MCV 87.9 03/08/2021   PLT 301 03/08/2021   No results for input(s): NA, K, CL, CO2, GLUCOSE, BUN, CREATININE, CALCIUM, GFRNONAA, GFRAA, PROT, ALBUMIN, AST, ALT, ALKPHOS, BILITOT, BILIDIR, IBILI  in the last 8760 hours.  RADIOGRAPHIC STUDIES: I have personally reviewed the radiological images as listed and agreed with the findings in the report. No results found.  ASSESSMENT & PLAN:   Lymphocytosis #Mild lymphocytosis-normal white count; slightly increased percentage of lymphocytes; although absolute lymphocyte count is normal.  Suspect reactive rather than lymphoproliferative disorder like CLL.  Check CBC' LDH; flow cytometry.  I do not think patient will need a bone marrow biopsy.  #Fatigue-question caregiver; I doubt if patient's fatigue is related to patient's mild lymphocytosis.  Thank you Dr.Hande for allowing me to participate in the care of your pleasant patient. Please do not hesitate to contact me with questions  or concerns in the interim.  # DISPOSITION: will call # labs today # follow up TBD-Dr.B    All questions were answered. The patient knows to call the clinic with any problems, questions or concerns.    Cammie Sickle, MD 03/08/2021 5:25 PM

## 2021-03-08 NOTE — Assessment & Plan Note (Addendum)
#  Mild lymphocytosis-normal white count; slightly increased percentage of lymphocytes; although absolute lymphocyte count is normal.  Suspect reactive rather than lymphoproliferative disorder like CLL.  Check CBC' LDH; flow cytometry.  I do not think patient will need a bone marrow biopsy.  #Fatigue-question caregiver; I doubt if patient's fatigue is related to patient's mild lymphocytosis.  Thank you Dr.Hande for allowing me to participate in the care of your pleasant patient. Please do not hesitate to contact me with questions or concerns in the interim.  # DISPOSITION: will call # labs today # follow up TBD-Dr.B

## 2021-03-08 NOTE — Progress Notes (Signed)
Patient here for initial visit, she reports dyspnea with activity.

## 2021-03-11 LAB — COMP PANEL: LEUKEMIA/LYMPHOMA: Immunophenotypic Profile: 0

## 2021-03-18 ENCOUNTER — Telehealth: Payer: Self-pay | Admitting: *Deleted

## 2021-03-18 DIAGNOSIS — D72820 Lymphocytosis (symptomatic): Secondary | ICD-10-CM

## 2021-03-18 NOTE — Telephone Encounter (Addendum)
Patient called asking if her lab results have come back yet. It looks like they have. Patient has no follow up appointment. 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   ANNOTATION COMMENT IMP Comment VC   Comment: (NOTE)  If the patient is known to have CLL or SLL, the finding represents  involvement by the lymphoma. If the patient is not known to have CLL  or  SLL, further investigation is recommended if clinically indicated. In  the  absence of CLL or SLL, small (<5,000/uL) clonal B-cell populations in  the  blood are classified as monoclonal B-cell lymphocytosis. Clinical  correlation is required.   CLINICAL INFO Comment VC   Comment: (NOTE)  Accompanying CBC dated 03/08/2021 shows: WBC count 5.8, Neu 2.6, Lym  2.6,  Mon 0.5   Specimen Type Comment   Comment: Peripheral blood  ASSESSMENT OF LEUKOCYTES Comment   Comment: (NOTE)  A CD5+/CD23+ monoclonal B cell population is detected with kappa light  chain restriction, representing 4% of B-cells and <1% of leukocytes,  immunophenotype consistent with chronic lymphocytic leukemia/small  lymphocytic lymphoma. CD38 is positive in 13% of clonal B-cells.  There is no loss of, or aberrant expression of, the pan T cell  antigens to  suggest a neoplastic T cell process.  CD4:CD8 ratio 1.3  CD57 positive cells are increased but are composed of a mixture of CD4  positive cells, CD8 positive cells and NK-cells, most consistent with  a  reactive process. Clinical correlation is recommended.  No circulating blasts are detected.  There is no immunophenotypic evidence of abnormal myeloid maturation.  Analysis of the leukocyte population shows:  granulocytes 50%,  monocytes 6%,  lymphocytes 44%, blasts <0.1%, B cells 5%, T cells 33%, LGLs 12%, NK  cells  6%.   % Viable Cells Comment VC   Comment: 96%  Immunophenotypic Profile 0 VC   Comment: Comment  Abnormal cell population: present  1% of total cells (Phenotype below)   ANALYSIS AND GATING STRATEGY Comment   Comment: 8 color analysis with CD45/SSC gating  IMMUNOPHENOTYPING STUDY Comment   Comment: (NOTE)  CD2    (-)      CD3    (-)  CD4    (-)      CD5    (+)  CD7    (-)      CD8    (-)  CD10   (-)      CD11b   (-)  CD11c   (-)      CD13   (-)  CD14   (-)      CD15   (-)  CD16   (-)      CD19   (+) Moderate  CD20   (+) Dim    CD22   (+) Moderate  CD23   (+)      CD33   (-)  CD34   (-)      CD38   (-)  CD45   (+)      CD56   (-)  CD57   (-)      CD103   (-)  FMC-7   (-)      HLA-DR  (+)  KAPPA   (+) Dim    LAMBDA  (-)  CD64   (-)   PATHOLOGIST NAME Comment   Comment: Katheran James, M.D. Ph.D  COMMENT: Comment VC   Comment: (NOTE)  Each antibody in this assay was utilized to assess for potential  abnormalities of studied cell populations or to characterize  identified abnormalities.  This test was developed and its performance characteristics  determined by [facility]. It has not been cleared or approved by the  U.S. Food and Drug Administration.  The FDA has determined that such clearance or approval is not  necessary. This test is used for clinical purposes. It should not  be regarded as investigational or for research.  Performed At: [facility]  [address] [place], [place] [id number]  Katina Degree MDPhD [id number]  Performed At: [facility]  [address] [place], [place] [id number]  Katina Degree MDPhD [id number]   Resulting Agency [facility]         Specimen Collected: 03/08/21  14:07 Last Resulted: 03/11/21 17:36     Lab Flowsheet   Order Details   View Encounter   Lab and Collection Details   Routing   Result History     VC=Value has a corrected status      Result Care Coordination   Patient Communication  Add Comments Not seen Back to Top        Other Results from 03/08/2021   Technologist smear review Order: [id number]  Status: Final result   Visible to patient: No (inaccessible in [place])   Next appt: None   Dx: Lymphocytosis   0 Result Notes  Component 10 d ago  Tech Review Normal RBC, WBC, and platelet   Comment: Performed at [facility], [address] [place], [place] [address]  Resulting Agency [facility] LAB         Specimen Collected: 03/08/21 14:09 Last Resulted: 03/08/21 15:11     Lab Flowsheet   Order Details   View Encounter   Lab and Collection Details   Routing   Result History        Result Care Coordination   Patient Communication  Add Comments Not seen Back to Top         Lactate dehydrogenase Order: [id number]  Status: Final result    Visible to patient: No (inaccessible in MyChart)    Next appt: None    Dx: Lymphocytosis    0 Result Notes    Ref Range & Units 10 d ago  LDH 98 - 192 U/L 156   Comment: Performed at [facility], [address] [place], [place] [address]  Resulting Agency  [facility] CLIN LAB          Specimen Collected: 03/08/21 14:07 Last Resulted: 03/08/21 14:44     Lab Flowsheet    Order Details    View Encounter    Lab and Collection Details    Routing    Result History         Result Care Coordination   Patient Communication  Add Comments Not seen Back to Top          CBC with Differential/Platelet Order: [id number]  Status: Final result    Visible to patient: No (inaccessible in MyChart)    Next appt: None    Dx: Lymphocytosis    0 Result Notes    Ref Range & Units 10 d ago  WBC 4.0  - 10.5 K/uL  5.8   RBC 3.87 - 5.11 MIL/uL 4.97   Hemoglobin 12.0 - 15.0 g/dL 14.5   HCT 36.0 - 46.0 % 43.7   MCV 80.0 - 100.0 fL 87.9   MCH 26.0 - 34.0 pg 29.2   MCHC 30.0 - 36.0 g/dL 33.2   RDW 11.5 - 15.5 % 13.2   Platelets 150 - 400 K/uL 301   nRBC 0.0 - 0.2 % 0.0   Neutrophils Relative % % 45   Neutro Abs 1.7 - 7.7 K/uL 2.6   Lymphocytes Relative % 44   Lymphs Abs 0.7 - 4.0 K/uL 2.6   Monocytes Relative % 9   Monocytes Absolute 0.1 - 1.0 K/uL 0.5   Eosinophils Relative % 1   Eosinophils Absolute 0.0 - 0.5 K/uL 0.1   Basophils Relative % 1   Basophils Absolute 0.0 - 0.1 K/uL 0.1   Immature Granulocytes % 0   Abs Immature Granulocytes 0.00 - 0.07 K/uL 0.01   Comment: Performed at [facility], [address] [place], [place] [address]  Resulting Agency  [facility] LAB          Specimen Collected: 03/08/21 14:07 Last Resulted: 03/08/21 14:26

## 2021-03-18 NOTE — Telephone Encounter (Signed)
Dr. B - please advise. 

## 2021-03-18 NOTE — Telephone Encounter (Signed)
On 4/4-spoke to patient regarding results of the flow cytometry; positive for monoclonal B-cell lymphocytosis; which merits no further work-up at this time.   Recommend follow-up-12 months; MD; labs-CBC/BMP/LDH.  GB

## 2021-03-19 NOTE — Addendum Note (Signed)
Addended by: Gloris Ham on: 03/19/2021 12:14 PM   Modules accepted: Orders

## 2021-07-19 ENCOUNTER — Other Ambulatory Visit (HOSPITAL_COMMUNITY): Payer: Self-pay | Admitting: Family Medicine

## 2021-07-19 ENCOUNTER — Other Ambulatory Visit: Payer: Self-pay | Admitting: Family Medicine

## 2021-07-19 ENCOUNTER — Ambulatory Visit
Admission: RE | Admit: 2021-07-19 | Discharge: 2021-07-19 | Disposition: A | Discharge status: Home / Self Care | Payer: Medicare Other | Source: Ambulatory Visit | Attending: Family Medicine | Admitting: Family Medicine

## 2021-07-19 ENCOUNTER — Other Ambulatory Visit: Payer: Self-pay

## 2021-07-19 DIAGNOSIS — R1032 Left lower quadrant pain: Secondary | ICD-10-CM | POA: Insufficient documentation

## 2021-07-19 MED ORDER — IOHEXOL 350 MG/ML SOLN
75.0000 mL | Freq: Once | INTRAVENOUS | Status: AC | PRN
  Administered 2021-07-19: 75 mL via INTRAVENOUS

## 2021-07-22 DIAGNOSIS — I700 Atherosclerosis of aorta: Secondary | ICD-10-CM | POA: Insufficient documentation

## 2021-08-30 ENCOUNTER — Other Ambulatory Visit: Payer: Self-pay | Admitting: Internal Medicine

## 2021-08-30 DIAGNOSIS — Z1231 Encounter for screening mammogram for malignant neoplasm of breast: Secondary | ICD-10-CM

## 2021-09-17 ENCOUNTER — Ambulatory Visit
Admission: RE | Admit: 2021-09-17 | Discharge: 2021-09-17 | Disposition: A | Discharge status: Home / Self Care | Payer: Medicare Other | Source: Ambulatory Visit | Attending: Internal Medicine | Admitting: Internal Medicine

## 2021-09-17 ENCOUNTER — Other Ambulatory Visit: Payer: Self-pay

## 2021-09-17 DIAGNOSIS — Z1231 Encounter for screening mammogram for malignant neoplasm of breast: Secondary | ICD-10-CM | POA: Diagnosis not present

## 2021-11-01 ENCOUNTER — Ambulatory Visit: Admission: RE | Admit: 2021-11-01 | Payer: Medicare Other | Source: Home / Self Care

## 2021-11-01 ENCOUNTER — Encounter: Admission: RE | Payer: Self-pay | Source: Home / Self Care

## 2021-11-01 SURGERY — COLONOSCOPY WITH PROPOFOL
Anesthesia: General

## 2022-03-10 ENCOUNTER — Other Ambulatory Visit: Payer: Medicare Other

## 2022-03-10 ENCOUNTER — Ambulatory Visit: Payer: Medicare Other | Admitting: Internal Medicine

## 2022-04-01 ENCOUNTER — Other Ambulatory Visit: Payer: Self-pay

## 2022-04-01 ENCOUNTER — Encounter: Payer: Self-pay | Admitting: Internal Medicine

## 2022-04-01 ENCOUNTER — Inpatient Hospital Stay (HOSPITAL_BASED_OUTPATIENT_CLINIC_OR_DEPARTMENT_OTHER): Payer: Medicare Other | Admitting: Internal Medicine

## 2022-04-01 ENCOUNTER — Inpatient Hospital Stay: Payer: Medicare Other | Attending: Internal Medicine

## 2022-04-01 DIAGNOSIS — M810 Age-related osteoporosis without current pathological fracture: Secondary | ICD-10-CM | POA: Insufficient documentation

## 2022-04-01 DIAGNOSIS — G4733 Obstructive sleep apnea (adult) (pediatric): Secondary | ICD-10-CM | POA: Insufficient documentation

## 2022-04-01 DIAGNOSIS — Z803 Family history of malignant neoplasm of breast: Secondary | ICD-10-CM | POA: Diagnosis not present

## 2022-04-01 DIAGNOSIS — D72820 Lymphocytosis (symptomatic): Secondary | ICD-10-CM

## 2022-04-01 DIAGNOSIS — Z79899 Other long term (current) drug therapy: Secondary | ICD-10-CM | POA: Insufficient documentation

## 2022-04-01 DIAGNOSIS — I341 Nonrheumatic mitral (valve) prolapse: Secondary | ICD-10-CM | POA: Insufficient documentation

## 2022-04-01 DIAGNOSIS — Z8616 Personal history of COVID-19: Secondary | ICD-10-CM | POA: Insufficient documentation

## 2022-04-01 DIAGNOSIS — I10 Essential (primary) hypertension: Secondary | ICD-10-CM | POA: Diagnosis not present

## 2022-04-01 LAB — BASIC METABOLIC PANEL
Anion gap: 7 (ref 5–15)
BUN: 32 mg/dL — ABNORMAL HIGH (ref 8–23)
CO2: 28 mmol/L (ref 22–32)
Calcium: 10 mg/dL (ref 8.9–10.3)
Chloride: 104 mmol/L (ref 98–111)
Creatinine, Ser: 0.88 mg/dL (ref 0.44–1.00)
GFR, Estimated: >60 mL/min (ref >60)
Glucose, Bld: 117 mg/dL — ABNORMAL HIGH (ref 70–99)
Potassium: 4.2 mmol/L (ref 3.5–5.1)
Sodium: 139 mmol/L (ref 135–145)

## 2022-04-01 LAB — CBC WITH DIFFERENTIAL/PLATELET
Abs Immature Granulocytes: 0.01 10*3/uL (ref 0.00–0.07)
Basophils Absolute: 0.1 10*3/uL (ref 0.0–0.1)
Basophils Relative: 2 %
Eosinophils Absolute: 0.1 10*3/uL (ref 0.0–0.5)
Eosinophils Relative: 1 %
HCT: 45.1 % (ref 36.0–46.0)
Hemoglobin: 15 g/dL (ref 12.0–15.0)
Immature Granulocytes: 0 %
Lymphocytes Relative: 48 %
Lymphs Abs: 3 10*3/uL (ref 0.7–4.0)
MCH: 29.2 pg (ref 26.0–34.0)
MCHC: 33.3 g/dL (ref 30.0–36.0)
MCV: 87.9 fL (ref 80.0–100.0)
Monocytes Absolute: 0.4 10*3/uL (ref 0.1–1.0)
Monocytes Relative: 6 %
Neutro Abs: 2.6 10*3/uL (ref 1.7–7.7)
Neutrophils Relative %: 43 %
Platelets: 284 10*3/uL (ref 150–400)
RBC: 5.13 MIL/uL — ABNORMAL HIGH (ref 3.87–5.11)
RDW: 12.9 % (ref 11.5–15.5)
WBC: 6.2 10*3/uL (ref 4.0–10.5)
nRBC: 0 % (ref 0.0–0.2)

## 2022-04-01 LAB — LACTATE DEHYDROGENASE: LDH: 154 U/L (ref 98–192)

## 2022-04-01 NOTE — Assessment & Plan Note (Addendum)
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  Normal hemoglobin/platelets.  Monitor for now. ? ?#Fatigue-question caregiver; has CPAP- not using; I doubt if patient's fatigue is related to patient's mild lymphocytosis. ? ?# DISPOSITION:  ?# labs today ?# follow up  In 12 months- MD; labs- cbc/cmp;LDH--Dr.B ? ? ?

## 2022-04-01 NOTE — Progress Notes (Signed)
C/o low energy level. ?

## 2022-04-01 NOTE — Progress Notes (Signed)
[place] ?CONSULT NOTE ? ?Patient Care Team: ?Tracie Harrier, MD as PCP - General (Internal Medicine) ? ?CHIEF COMPLAINTS/PURPOSE OF CONSULTATION: Lymphocytosis ? ?# MARCH 2022-  ?PATH INTERP XXX-IMP Comment   ?Comment: (NOTE)  ?A CD5 and CD23 positive monoclonal B cell population detected, with  ?chronic  ?lymphocytic leukemia/small lymphocytic lymphoma (CLL/SLL) phenotype,  ?positive for CD20, CD22, CD19 and negative for CD38, <1% of  ?leukocytes,  ?<5,000/uL, see comment.  ? ?Oncology History  ? No history exists.  ? ? ? ?HISTORY OF PRESENTING ILLNESS: Alone.  Ambulating independently. ?Denise Hunter 71 y.o.  female with no prior history of malignancy -mild lymphocytosis is here for follow-up. ? ?C/o fatigue.  Her father passed away.  She has  been taking care of her mother.  Denies any night sweats.  Denies any weight loss. ? ?Patient not using CPAP because of pressure issues. ? ? ?Review of Systems  ?Constitutional:  Positive for malaise/fatigue. Negative for chills, diaphoresis, fever and weight loss.  ?HENT:  Negative for nosebleeds and sore throat.   ?Eyes:  Negative for double vision.  ?Respiratory:  Negative for cough, hemoptysis, sputum production, shortness of breath and wheezing.   ?Cardiovascular:  Negative for chest pain, palpitations, orthopnea and leg swelling.  ?Gastrointestinal:  Negative for abdominal pain, blood in stool, constipation, diarrhea, heartburn, melena, nausea and vomiting.  ?Genitourinary:  Negative for dysuria, frequency and urgency.  ?Musculoskeletal:  Positive for joint pain. Negative for back pain.  ?Skin: Negative.  Negative for itching and rash.  ?Neurological:  Negative for dizziness, tingling, focal weakness, weakness and headaches.  ?Endo/Heme/Allergies:  Does not bruise/bleed easily.  ?Psychiatric/Behavioral:  Negative for depression. The patient is not nervous/anxious and does not have insomnia.    ? ?MEDICAL HISTORY:  ?Past Medical History:   ?Diagnosis Date  ? History of COVID-19   ? Lymphocytosis   ? Mitral valve prolapse syndrome   ? OSA on CPAP   ? Osteoporosis   ? Primary hypertension   ? ? ?SURGICAL HISTORY: ?Past Surgical History:  ?Procedure Laterality Date  ? ABDOMINAL HYSTERECTOMY    ? BREAST CYST ASPIRATION    ? years ago  ? KNEE SURGERY    ? THYROID SURGERY    ? ? ?SOCIAL HISTORY: ?Social History  ? ?Socioeconomic History  ? Marital status: Married  ?  Spouse name: Not on file  ? Number of children: Not on file  ? Years of education: Not on file  ? Highest education level: Not on file  ?Occupational History  ? Not on file  ?Tobacco Use  ? Smoking status: Never  ? Smokeless tobacco: Never  ?Vaping Use  ? Vaping Use: Never used  ?Substance and Sexual Activity  ? Alcohol use: No  ? Drug use: No  ? Sexual activity: Never  ?Other Topics Concern  ? Not on file  ?Social History Narrative  ? Retired Marine scientist.  No smoking, alcohol abuse.  She had been taking care of her parents.  Parents recently moved to assisted living  ? ?Social Determinants of Health  ? ?Financial Resource Strain: Not on file  ?Food Insecurity: Not on file  ?Transportation Needs: Not on file  ?Physical Activity: Not on file  ?Stress: Not on file  ?Social Connections: Not on file  ?Intimate Partner Violence: Not on file  ? ? ?FAMILY HISTORY: ?Family History  ?Problem Relation Age of Onset  ? Skin cancer Mother   ? Cancer Maternal Grandfather   ? Breast cancer  Neg Hx   ? ? ?ALLERGIES:  is allergic to amitriptyline, bisphosphonates, celecoxib, and nsaids. ? ?MEDICATIONS:  ?Current Outpatient Medications  ?Medication Sig Dispense Refill  ? ALPRAZolam (XANAX) 0.5 MG tablet Take 0.5 mg by mouth 3 (three) times daily as needed for anxiety.    ? Calcium Carbonate-Vitamin D 600-200 MG-UNIT TABS Take 1 tablet by mouth in the morning and at bedtime.    ? hydrochlorothiazide (HYDRODIURIL) 25 MG tablet     ? levothyroxine (SYNTHROID) 88 MCG tablet Take 88 mcg by mouth daily. Take on an empty  stomach with a glass of water at least 30-60 minutes before breakfast    ? telmisartan (MICARDIS) 40 MG tablet Take 1 tablet by mouth daily.    ? triamcinolone (NASACORT ALLERGY 24HR) 55 MCG/ACT AERO nasal inhaler Place 2 sprays into the nose daily.    ? nitroGLYCERIN (NITROSTAT) 0.4 MG SL tablet Place 0.4 mg under the tongue as needed.    ? ?No current facility-administered medications for this visit.  ? ? ?  ?. ? ?PHYSICAL EXAMINATION: ?ECOG PERFORMANCE STATUS: 0 - Asymptomatic ? ?Vitals:  ? 04/01/22 1259  ?BP: 120/76  ?Pulse: 92  ?Temp: 97.7 ?F (36.5 ?C)  ?SpO2: 98%  ? ?Filed Weights  ? 04/01/22 1259  ?Weight: 175 lb 3.2 oz (79.5 kg)  ? ? ?Physical Exam ?Constitutional:   ?   Comments: Patient is walk independently.  She is alone.  ?HENT:  ?   Head: Normocephalic and atraumatic.  ?   Mouth/Throat:  ?   Pharynx: No oropharyngeal exudate.  ?Eyes:  ?   Pupils: Pupils are equal, round, and reactive to light.  ?Cardiovascular:  ?   Rate and Rhythm: Normal rate and regular rhythm.  ?Pulmonary:  ?   Effort: Pulmonary effort is normal. No respiratory distress.  ?   Breath sounds: Normal breath sounds. No wheezing.  ?Abdominal:  ?   General: Bowel sounds are normal. There is no distension.  ?   Palpations: Abdomen is soft. There is no mass.  ?   Tenderness: There is no abdominal tenderness. There is no guarding or rebound.  ?Musculoskeletal:     ?   General: No tenderness. Normal range of motion.  ?   Cervical back: Normal range of motion and neck supple.  ?Skin: ?   General: Skin is warm.  ?Neurological:  ?   Mental Status: She is alert and oriented to person, place, and time.  ?Psychiatric:     ?   Mood and Affect: Affect normal.  ? ? ? ?LABORATORY DATA:  ?I have reviewed the data as listed ?Lab Results  ?Component Value Date  ? WBC 6.2 04/01/2022  ? HGB 15.0 04/01/2022  ? HCT 45.1 04/01/2022  ? MCV 87.9 04/01/2022  ? PLT 284 04/01/2022  ? ?Recent Labs  ?  04/01/22 ?1257  ?NA 139  ?K 4.2  ?CL 104  ?CO2 28  ?GLUCOSE 117*   ?BUN 32*  ?CREATININE 0.88  ?CALCIUM 10.0  ?GFRNONAA >60  ? ? ?RADIOGRAPHIC STUDIES: ?I have personally reviewed the radiological images as listed and agreed with the findings in the report. ?No results found. ? ?ASSESSMENT & PLAN:  ? 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  Normal hemoglobin/platelets.  Monitor for now. ? ?#Fatigue-question caregiver; has CPAP- not using; I doubt if patient's fatigue is related to patient's mild lymphocytosis. ? ?# DISPOSITION:  ?# labs today ?# follow up  In 12 months- MD; labs-  cbc/cmp;LDH--Dr.B ? ? ?All questions were answered. The patient knows to call the clinic with any problems, questions or concerns. ?  ? Cammie Sickle, MD ?04/01/2022 1:36 PM ? ? ? ?

## 2022-04-14 ENCOUNTER — Other Ambulatory Visit: Payer: Self-pay | Admitting: Gastroenterology

## 2022-04-14 DIAGNOSIS — K5792 Diverticulitis of intestine, part unspecified, without perforation or abscess without bleeding: Secondary | ICD-10-CM

## 2022-04-15 ENCOUNTER — Ambulatory Visit
Admission: RE | Admit: 2022-04-15 | Discharge: 2022-04-15 | Disposition: A | Discharge status: Home / Self Care | Payer: Medicare Other | Attending: Gastroenterology | Admitting: Gastroenterology

## 2022-04-15 ENCOUNTER — Ambulatory Visit
Admission: RE | Admit: 2022-04-15 | Discharge: 2022-04-15 | Disposition: A | Discharge status: Home / Self Care | Payer: Medicare Other | Source: Ambulatory Visit | Attending: Gastroenterology | Admitting: Gastroenterology

## 2022-04-15 DIAGNOSIS — K5792 Diverticulitis of intestine, part unspecified, without perforation or abscess without bleeding: Secondary | ICD-10-CM

## 2022-05-02 NOTE — Discharge Instructions (Addendum)
Instructions after Knee Arthroscopy    James P. Hooten, Jr., M.D.     Dept. of Orthopaedics & Sports Medicine  [facility]  [address]  [place], [place]  [address]   Phone: [phone]   Fax: [phone]   DIET: Drink plenty of non-alcoholic fluids & begin a light diet. Resume your normal diet the day after surgery.  ACTIVITY:  You may use crutches or a walker with weight-bearing as tolerated, unless instructed otherwise. You may wean yourself off of the walker or crutches as tolerated.  Begin doing gentle exercises. Exercising will reduce the pain and swelling, increase motion, and prevent muscle weakness.   Avoid strenuous activities or athletics for a minimum of 4-6 weeks after arthroscopic surgery. Do not drive or operate any equipment until instructed.  WOUND CARE:  Place one to two pillows under the knee the first day or two when sitting or lying.  Continue to use the ice packs periodically to reduce pain and swelling. The small incisions in your knee are closed with nylon stitches. The stitches will be removed in the office. The bulky dressing may be removed on the second day after surgery. DO NOT TOUCH THE STITCHES. Put a Band-Aid over each stitch. Do NOT use any ointments or creams on the incisions.  You may bathe or shower after the stitches are removed at the first office visit following surgery.  MEDICATIONS: You may resume your regular medications. Please take the pain medication as prescribed. Do not take pain medication on an empty stomach. Do not drive or drink alcoholic beverages when taking pain medications.  CALL THE OFFICE FOR: Temperature above 101 degrees Excessive bleeding or drainage on the dressing. Excessive swelling, coldness, or paleness of the toes. Persistent nausea and vomiting.  FOLLOW-UP:  You should have an appointment to return to the office in 7-10 days after surgery.      [facility] Department Directory          [email or web address]       [email or web address]          Cardiology  Appointments: [place] - [phone] [place] - [phone]  Endocrinology  Appointments: [place] - [phone] [place] - [phone]  Gastroenterology  Appointments: [place] - [phone] [place] - [phone]        General Surgery   Appointments: [place] - [phone]  Internal Medicine/Family Medicine  Appointments: [place] - [phone] [place] - [phone] [place] - [phone]  Metabolic and Weigh Loss Surgery  Appointments: [place] - [phone]        Neurology  Appointments: [place] - [phone] [place] - [phone]  Neurosurgery  Appointments: [place] - [phone]  Obstetrics & Gynecology  Appointments: [place] - [phone] [place] - [phone]        Pediatrics  Appointments: [place] - [phone] [place] - [phone]  Physiatry  Appointments: [place] -[phone]  Physical Therapy  Appointments: [place] - [phone] [place] - [phone]        Podiatry  Appointments: [place] - [phone] [place] - [phone]  Pulmonology  Appointments: [place] - [phone]  Rheumatology  Appointments: [place] - [phone]        [place] Location: [facility]  [address] [place], [place]  [address]  [place] Location: [facility] [address] [place], [place]  [address]  [place] Location: [facility] [address] [place], [place]  [address]   AMBULATORY SURGERY  DISCHARGE INSTRUCTIONS   The drugs that you were given will stay in your system until tomorrow so for the next 24 hours you should   not:  Drive an automobile Make any legal decisions Drink any alcoholic beverage   You may resume regular meals tomorrow.  Today it is better to start with liquids and gradually work up to solid foods.  You may eat anything you prefer, but it is better to start  with liquids, then soup and crackers, and gradually work up to solid foods.   Please notify your doctor immediately if you have any unusual bleeding, trouble breathing, redness and pain at the surgery site, drainage, fever, or pain not relieved by medication.    Your post-operative visit with Dr.                                       is: Date:                        Time:    Please call to schedule your post-operative visit.  Additional Instructions: 

## 2022-05-15 DIAGNOSIS — G8929 Other chronic pain: Secondary | ICD-10-CM | POA: Insufficient documentation

## 2022-05-15 DIAGNOSIS — M25562 Pain in left knee: Secondary | ICD-10-CM | POA: Insufficient documentation

## 2022-05-15 DIAGNOSIS — Z6833 Body mass index (BMI) 33.0-33.9, adult: Secondary | ICD-10-CM | POA: Insufficient documentation

## 2022-05-20 ENCOUNTER — Encounter
Admission: RE | Admit: 2022-05-20 | Discharge: 2022-05-20 | Disposition: A | Discharge status: Home / Self Care | Payer: Medicare Other | Source: Ambulatory Visit | Attending: Orthopedic Surgery | Admitting: Orthopedic Surgery

## 2022-05-20 ENCOUNTER — Other Ambulatory Visit: Payer: Medicare Other

## 2022-05-20 VITALS — Ht 61.5 in | Wt 173.0 lb

## 2022-05-20 DIAGNOSIS — I10 Essential (primary) hypertension: Secondary | ICD-10-CM

## 2022-05-20 DIAGNOSIS — Z01812 Encounter for preprocedural laboratory examination: Secondary | ICD-10-CM

## 2022-05-20 DIAGNOSIS — I341 Nonrheumatic mitral (valve) prolapse: Secondary | ICD-10-CM

## 2022-05-20 HISTORY — DX: Malignant (primary) neoplasm, unspecified: C80.1

## 2022-05-20 HISTORY — DX: Nausea with vomiting, unspecified: R11.2

## 2022-05-20 HISTORY — DX: Chronic kidney disease, unspecified: N18.9

## 2022-05-20 HISTORY — DX: Hypothyroidism, unspecified: E03.9

## 2022-05-20 HISTORY — DX: Other specified postprocedural states: Z98.890

## 2022-05-20 NOTE — Patient Instructions (Addendum)
Your procedure is scheduled on: Wednesday May 28, 2022. Report to Day Surgery inside [facility] 2nd floor, stop by admissions desk before getting on elevator. To find out your arrival time please call [phone] between 1PM - 3PM on Tuesday May 27, 2022.  Remember: Instructions that are not followed completely may result in serious medical risk,  up to and including death, or upon the discretion of your surgeon and anesthesiologist your  surgery may need to be rescheduled.     _X__ 1. Do not eat food after midnight the night before your procedure.                 No chewing gum or hard candies. You may drink clear liquids up to 2 hours                 before you are scheduled to arrive for your surgery- DO not drink clear                 liquids within 2 hours of the start of your surgery.                 Clear Liquids include:  water, apple juice without pulp, clear Gatorade, G2 or                  Gatorade Zero (avoid Red/Purple/Blue), Black Coffee or Tea (Do not add                 anything to coffee or tea).  __X__2.   Complete the "Ensure Clear Pre-surgery Clear Carbohydrate Drink" provided to you, 2 hours before arrival. **If you are diabetic you will be provided with an alternative drink, Gatorade Zero or G2.  __X__3.  On the morning of surgery brush your teeth with toothpaste and water, you                may rinse your mouth with mouthwash if you wish.  Do not swallow any toothpaste or mouthwash.     _X__ 4.  No Alcohol for 24 hours before or after surgery.   _X__ 5.  Do Not Smoke or use e-cigarettes For 24 Hours Prior to Your Surgery.                 Do not use any chewable tobacco products for at least 6 hours prior to                 Surgery.  _X__  6.  Do not use any recreational drugs (marijuana, cocaine, heroin, ecstasy, MDMA or other)                For at least one week prior to your surgery.  Combination of these drugs with anesthesia                 May have life threatening results.  ____  7.  Bring all medications with you on the day of surgery if instructed.   __X__8.  Notify your doctor if there is any change in your medical condition      (cold, fever, infections).     Do not wear jewelry, make-up, hairpins, clips or nail polish. Do not wear lotions, powders, or perfumes. You may wear deodorant. Do not shave 48 hours prior to surgery.  Do not bring valuables to the hospital.    [facility] is not responsible for any belongings or valuables.  Contacts, dentures or bridgework may not be  worn into surgery. Leave your suitcase in the car. After surgery it may be brought to your room. For patients admitted to the hospital, discharge time is determined by your treatment team.   Patients discharged the day of surgery will not be allowed to drive home.   Make arrangements for someone to be with you for the first 24 hours of your Same Day Discharge.   __X__ Take these medicines the morning of surgery with A SIP OF WATER:    1. ALPRAZolam (XANAX) 0.5 MG (if needed)  2. levothyroxine (SYNTHROID) 88 MCG  3. doxycycline (VIBRAMYCIN) 100 MG  4.  5.  6.  ____ Fleet Enema (as directed)   __X__ Use CHG Soap (or wipes) as directed  ____ Use Benzoyl Peroxide Gel as instructed  ____ Use inhalers on the day of surgery  ____ Stop metformin 2 days prior to surgery    ____ Take 1/2 of usual insulin dose the night before surgery. No insulin the morning          of surgery.   ____ Call your PCP, cardiologist, or Pulmonologist if taking Coumadin/Plavix/aspirin and ask when to stop before your surgery.   __X__ One Week prior to surgery- Stop Anti-inflammatories such as Ibuprofen, Aleve, Advil, Motrin, meloxicam (MOBIC), diclofenac, etodolac, ketorolac, Toradol, Daypro, piroxicam, Goody's or BC powders. OK TO USE TYLENOL IF NEEDED   __X__ One week prior to surgery- Stop ALL vitamins and or supplements until after surgery.  (VITAMIN C), (APPLE CIDER), Calcium-Magnesium-Vitamin D, ALIVE MULTI-VITAMIN and zinc    ____ Bring C-Pap to the hospital.    If you have any questions regarding your pre-procedure instructions,  Please call Pre-admit Testing at [phone]

## 2022-05-21 ENCOUNTER — Encounter
Admission: RE | Admit: 2022-05-21 | Discharge: 2022-05-21 | Disposition: A | Discharge status: Home / Self Care | Payer: Medicare Other | Source: Ambulatory Visit | Attending: Orthopedic Surgery | Admitting: Orthopedic Surgery

## 2022-05-21 DIAGNOSIS — I341 Nonrheumatic mitral (valve) prolapse: Secondary | ICD-10-CM | POA: Insufficient documentation

## 2022-05-21 DIAGNOSIS — Z01810 Encounter for preprocedural cardiovascular examination: Secondary | ICD-10-CM | POA: Diagnosis present

## 2022-05-21 DIAGNOSIS — I10 Essential (primary) hypertension: Secondary | ICD-10-CM | POA: Insufficient documentation

## 2022-05-21 DIAGNOSIS — Z01812 Encounter for preprocedural laboratory examination: Secondary | ICD-10-CM

## 2022-05-25 NOTE — H&P (Signed)
ORTHOPAEDIC HISTORY & PHYSICAL Gwenlyn Fudge, [place] - 05/15/2022 11:00 AM EDT Formatting of this note is different from the original. [place] [facility] Chief Complaint:   Chief Complaint  Patient presents with   Knee Pain  H & P LEFT KNEE   History of Present Illness:   Denise Hunter is a 71 y.o. female that presents to clinic today for her preoperative history and evaluation. Patient presents unaccompanied. The patient is scheduled to undergo a left total knee arthroscopy with lysis of adhesions on 05/28/22 by Dr. Marry Guan. Patient is s/p left total knee arthroplasty in 2009. Patient developed significant stiffness to the bilateral knees, left worse than right, following her knee replacements. She reports associated decreased range of motion of the left knee with She denies associated numbness or tingling.  The patient's symptoms have progressed to the point that they decrease her quality of life. The patient has previously undergone conservative treatment including NSAIDS and injections to the knee without adequate control of her symptoms.  Patient is followed by Dr Clayborn Bigness, cleared for surgery. Denies history of lumbar surgery, history of DVT.   Past Medical, Surgical, Family, Social History, Allergies, Medications:   Past Medical History:  Past Medical History:  Diagnosis Date   Adenomatous polyps   Anemia   Diverticulosis   Hemorrhoids   History of kidney stones   History of shingles   Hypertension   IBS (irritable bowel syndrome)   Sleep apnea   Thyroid disease   Past Surgical History:  Past Surgical History:  Procedure Laterality Date   COLONOSCOPY 11/21/1998  Adenomatous Polyps   Left total knee arthroplasty using computer-assisted navigation 01/17/2008  Dr Marry Guan   Right total knee arthroplasty using computer-assisted navigation 04/07/2011  Dr Marry Guan   APPENDECTOMY   Benign breast cyst   COLONOSCOPY 06/24/2012,  03/31/2005  PH Adenomatous Polyps: CBF 06/2017; Recall Ltr mailed 05/22/2017 (dw)   HYSTERECTOMY   KNEE ARTHROSCOPY   THYROIDECTOMY FOR REMOVAL REMAINING TISSUE   Current Medications:  Current Outpatient Medications  Medication Sig Dispense Refill   ALPRAZolam (XANAX) 0.5 MG tablet Take 1 tablet (0.5 mg total) by mouth 3 (three) times daily as needed for Sleep for up to 30 days 90 tablet 0   calcium carbonate-vitamin D3 (CALTRATE 600+D) 600 mg(1,'500mg'$ ) -200 unit tablet Take 1 tablet by mouth 2 (two) times daily with meals   docusate (COLACE) 100 MG capsule Take 100 mg by mouth 2 (two) times daily.   hydroCHLOROthiazide (HYDRODIURIL) 25 MG tablet Take 1 tablet (25 mg total) by mouth once daily 30 tablet 11   multivitamin tablet Take 1 tablet by mouth once daily.   nitroGLYcerin (NITROSTAT) 0.4 MG SL tablet PLACE 1 TABLET UNDER THE TONGUE EVERY 5 MINUTES AS NEEDED FOR CHEST PAIN. MAY TAKE UP TO 3 DOSES 25 tablet 0   SYNTHROID 88 mcg tablet TAKE 1 TABLET DAILY 90 tablet 3   telmisartan (MICARDIS) 40 MG tablet Take 1 tablet (40 mg total) by mouth once daily 90 tablet 1   triamcinolone (NASACORT AQ) 55 mcg nasal spray Place 2 sprays into both nostrils once daily.   cholecalciferol (VITAMIN D3) 2,000 unit capsule Take 2,000 Units by mouth once daily   peg-electrolyte (NULYTELY) solution Use as directed for colon prep 4000 mL 0   No current facility-administered medications for this visit.   Allergies:  Allergies  Allergen Reactions   Amitriptyline Palpitations  Chest pain- sent to Acute Care with angina  Bisphosphonates Diarrhea   Celebrex [Celecoxib] Other (See Comments)  Elevates BP   Nsaids (Non-Steroidal Anti-Inflammatory Drug) Other (See Comments)  Elevates BP   Social History:  Social History   Socioeconomic History   Marital status: Married  Spouse name: Eddie Dibbles   Number of children: 2   Years of education: 48  Occupational History   Occupation: Retired  Comment: Nursing   Tobacco Use   Smoking status: Never   Smokeless tobacco: Never  Scientific laboratory technician Use: Never used  Substance and Sexual Activity   Alcohol use: No  Alcohol/week: 0.0 standard drinks   Drug use: No   Sexual activity: Defer   Family History:  Family History  Problem Relation Age of Onset   High blood pressure (Hypertension) Sister   Hiatal hernia Brother   Mitral valve prolapse Sister   Review of Systems:   A 10+ ROS was performed, reviewed, and the pertinent orthopaedic findings are documented in the HPI.   Physical Examination:   BP 120/84 (BP Location: Left upper arm, Patient Position: Sitting, BP Cuff Size: Adult)  Ht 154.9 cm ('5\' 1"'$ )  Wt 78.8 kg (173 lb 12.8 oz)  LMP (LMP Unknown) Comment: Hysterectomy  BMI 32.84 kg/m   Patient is a well-developed, well-nourished female in no acute distress. Patient has normal mood and affect. Patient is alert and oriented to person, place, and time.   HEENT: Atraumatic, normocephalic. Pupils equal and reactive to light. Extraocular motion intact. Noninjected sclera.  Cardiovascular: Regular rate and rhythm, with no murmurs, rubs, or gallops. Distal pulses palpable.  Respiratory: Lungs clear to auscultation bilaterally.   Well-healed surgical incision over the left knee.   Left Knee: Soft tissue swelling: none Effusion: none Erythema: none Crepitance: none Tenderness: hamstrings Alignment: normal Mediolateral laxity: stable Patellar tracking: Fair tracking without evidence of subluxation or tilt. Limited patellar mobility. Atrophy: Generalized quadriceps atrophy. Quadriceps tone was fair to good. Incision: Healing well without evidence of infection Range of motion: 0/5/55 degrees 0/10/45  Able to plantarflex and dorsiflex the left ankle. Able to flex and extend the toes.  Sensation intact over the saphenous, lateral sural cutaneous, superficial fibular, and deep fibular nerve distributions.  Tests Performed/Reviewed:   X-rays  Anteroposterior, lateral, and sunrise views of the left knee were reviewed. Images reveal a left total knee arthroplasty without signs of loosening, migration, or failure. No periprosthetic lucency or periprosthetic fractures noted.  Impression:   ICD-10-CM  1. Arthrofibrosis of total knee arthroplasty, subsequent encounter T84.82XD   Plan:   The patient's symptoms are consistent with arthrofibrosis of her left total knee arthroplasty. Having failed conservative treatment, the patient has elected to proceed surgical intervention. The patient will undergo left knee arthroscopy with lysis of adhesions with Dr. Marry Guan. The risks of surgery, including blood clot and infection, were discussed with the patient. Measures to reduce these risks, including the use of anticoagulation, perioperative antibiotics, and early ambulation were discussed. The importance of postoperative physical therapy was discussed with the patient. Order was provided for outpatient therapy to begin today following surgery. The patient elects to proceed with surgery. The patient is instructed to stop all blood thinners prior to surgery. The patient is instructed to call the hospital the day before surgery to learn of the proper arrival time.   Contact our office with any questions or concerns. Follow up as indicated, or sooner should any new problems arise, if conditions worsen, or if they are otherwise concerned.   Gwenlyn Fudge,  PA -[facility] [place],  [address] Phone: [phone]  This note was generated in part with voice recognition software and I apologize for any typographical errors that were not detected and corrected.  Electronically signed by Gwenlyn Fudge, PA at 05/15/2022 1:56 PM EDT

## 2022-05-26 ENCOUNTER — Encounter: Payer: Self-pay | Admitting: Gastroenterology

## 2022-05-26 NOTE — Progress Notes (Signed)
Perioperative Services  Pre-Admission/Anesthesia Testing Clinical Review  Date: 05/26/22  Patient Demographics:  Name: Denise Hunter DOB:   January 14, 1951 MRN:   [id number]  Planned Surgical Procedure(s):    Case: [id number] Date/Time: 05/28/22 1526   Procedure: ARTHROSCOPY KNEE WITH LYSIS OF ADHESIONS (Left: Knee)   Anesthesia type: Choice   Pre-op diagnosis:      Arthrofibrosis of total knee arthroplasty, subsequent encounter T84.82XD     Status post total left knee replacement Z96.652   Location: [facility] OR ROOM 01 / [facility]   Surgeons: Dereck Leep, MD   NOTE: Available PAT nursing documentation and vital signs have been reviewed. Clinical nursing staff has updated patient's PMH/PSHx, current medication list, and drug allergies/intolerances to ensure comprehensive history available to assist in medical decision making as it pertains to the aforementioned surgical procedure and anticipated anesthetic course. Extensive review of available clinical information performed. [place] [facility] and PSHx updated with any diagnoses/procedures that  may have been inadvertently omitted during her intake with the pre-admission testing department's nursing staff.  Clinical Discussion:  Denise Hunter is a 71 y.o. female who is submitted for pre-surgical anesthesia review and clearance prior to her undergoing the above procedure. Patient has never been a smoker. Pertinent PMH includes: atypical angina, diastolic dysfunction, valvular heart disease, BILATERAL carotid bruits, HTN, HLD, hypothyroidism, CKD, OSAH (noncompliant with nocturnal PAP therapy), anemia, OA, anxiety (on BZO).   Patient is followed by cardiology Clayborn Bigness, MD). She was last seen in the cardiology clinic on 04/29/2022; notes reviewed.  At the time of her clinic visit, patient with episodes of insignificant chest discomfort with associated shortness of breath.  Symptoms have been ongoing for quite some time and  reported to be stable.  Patient denied any PND, orthopnea, palpitations, significant peripheral edema, vertiginous symptoms, or presyncope/syncope.  Patient with a past medical history significant for cardiovascular diagnoses.  Myocardial perfusion imaging study performed on 05/04/2019 revealed a normal left ventricular systolic function with an EF of 65%.  There were no regional wall motion abnormalities.  There was no evidence of stress-induced myocardial ischemia or arrhythmia; no scintigraphic evidence of scar.  Study determined to be normal and low risk.  Last TTE was performed on 03/07/2021 revealing a normal left ventricular systolic function with an EF of >55%.  There was trivial to mild mitral, tricuspid, and pulmonary valve regurgitation.  There was no evidence of a significant transvalvular gradient to suggest stenosis.  Blood pressure reasonably controlled at 132/78 on currently prescribed diuretic and ARB therapies.  Patient not currently taking any type of lipid-lowering therapy for her HLD diagnosis or ASCVD prevention.  Patient is controlling this diagnosis with diet lifestyle modification alone; statin therapy has been recommended.  She has a supply of SL NTG to use on a as needed basis; denied recent use.  Patient is not diabetic.  Patient does have a diagnosis of OSAH, for which nocturnal PAP therapy has been prescribed.  Patient reported to be noncompliant with Pap therapy citing the fact that "too much air comes out". Functional capacity, as defined by DASI, is documented as being >/= 4 METS.  No changes were made to her medication regimen.  Patient to follow-up with outpatient cardiology in 6 months or sooner if needed.  Denise Hunter is scheduled for an elective LEFT ARTHROSCOPY KNEE WITH LYSIS OF ADHESIONS on 05/28/2022 with Dr. Skip Estimable, MD.  Given patient's past medical history significant for cardiovascular diagnoses, presurgical cardiac clearance  was sought by the PAT team.  Per cardiology, "this patient is optimized for surgery and may proceed with the planned procedural course with a LOW risk of significant perioperative cardiovascular complications".  In review of her medication reconciliation, the patient is not noted to be taking any type of anticoagulation or antiplatelet therapies that would need to be held during her perioperative course.  Patient denies previous perioperative complications with anesthesia in the past. Patient has a PMH (+) for PONV. Symptoms and history of PONV will be discussed with patient by anesthesia team on the day of her procedure. Interventions will be ordered as deemed necessary based on patient's individual care needs as determined by anesthesiologist. In review her EMR, there are no records available for review pertaining to past procedural/anesthetic courses within the [facility] system.     05/20/2022   12:04 PM 04/01/2022   12:59 PM 03/08/2021    1:39 PM  Vitals with BMI  Height 5' 1.5" '5\' 1"'$    Weight 173 lbs 175 lbs 3 oz 173 lbs  BMI 16.10 96.04   Systolic  540 981  Diastolic  76 88  Pulse  92 80    Providers/Specialists:   NOTE: Primary physician provider listed below. Patient may have been seen by APP or partner within same practice.   PROVIDER ROLE / SPECIALTY LAST OV  Hooten, Laurice Record, MD Orthopedics (Surgeon) 05/15/2022  Tracie Harrier, MD Primary Care Provider 05/15/2022  Katrine Coho, MD Cardiology 04/29/2022   Allergies:  Amitriptyline, Bisphosphonates, Celecoxib, and Nsaids  Current Home Medications:   No current facility-administered medications for this encounter.    ALPRAZolam (XANAX) 0.5 MG tablet   Ascorbic Acid (VITAMIN C) 1000 MG tablet   Calcium Carbonate-Vitamin D 600-200 MG-UNIT TABS   Calcium-Magnesium-Vitamin D 600-300-400 LIQD   docusate sodium (COLACE) 100 MG capsule   doxycycline (VIBRAMYCIN) 100 MG capsule   hydrochlorothiazide (HYDRODIURIL) 25 MG tablet   levothyroxine  (SYNTHROID) 88 MCG tablet   Misc Natural Products (APPLE CIDER VINEGAR DIET PO)   Multiple Vitamins-Minerals (ALIVE MULTI-VITAMIN PO)   nitroGLYCERIN (NITROSTAT) 0.4 MG SL tablet   telmisartan (MICARDIS) 40 MG tablet   triamcinolone (NASACORT) 55 MCG/ACT AERO nasal inhaler   Zinc 50 MG CAPS   History:   Past Medical History:  Diagnosis Date   Adenomatous polyps    Anemia    Anxiety    a.) on BZO (alprazolam) PRN   Atypical chest pain    Bilateral carotid bruits    Chronic kidney disease    Diastolic dysfunction 19/14/7829   a.) TTE 02/20/2018: EF > 55%; mild LAE; G1DD.   Diverticulosis    Hemorrhoids    History of COVID-19 12/19/2020   HLD (hyperlipidemia)    Hypothyroidism    IBS (irritable bowel syndrome)    Lymphocytosis    Mitral valve prolapse syndrome    Nephrolithiasis    OSA on CPAP    Osteoporosis    PONV (postoperative nausea and vomiting)    Primary hypertension    Uterine cancer (HCC)    Valvular heart disease    a.) TTE 07/06/2015: EF >55%; mild LVH; mild LAE, mild MR/TR/PR. b.) TTE 08/13/2016: EF >55%; triv MR, mild TR. c.) TTE 02/20/2018: EF 55%; mild LAE, triv PR, mild MR, mod TR. d.) TTE 05/04/2019: EF >55%; LVH; mild MR/TR/PR. e.) TTE 03/07/2021: EF >55%, triv MR/PR, mild TR.   Past Surgical History:  Procedure Laterality Date   ABDOMINAL HYSTERECTOMY     BREAST CYST  ASPIRATION     years ago   JOINT REPLACEMENT     KNEE SURGERY     THYROID SURGERY     Family History  Problem Relation Age of Onset   Skin cancer Mother    Cancer Maternal Grandfather    Breast cancer Neg Hx    Social History   Tobacco Use   Smoking status: Never   Smokeless tobacco: Never  Vaping Use   Vaping Use: Never used  Substance Use Topics   Alcohol use: No   Drug use: No    Pertinent Clinical Results:  LABS: Labs reviewed: Acceptable for surgery.  No visits with results within 3 Day(s) from this visit.  Latest known visit with results is:  Appointment on  04/01/2022  Component Date Value Ref Range Status   LDH 04/01/2022 154  98 - 192 U/L Final   Performed at [facility], [address] [place], [place] [address]   Sodium 04/01/2022 139  135 - 145 mmol/L Final   Potassium 04/01/2022 4.2  3.5 - 5.1 mmol/L Final   Chloride 04/01/2022 104  98 - 111 mmol/L Final   CO2 04/01/2022 28  22 - 32 mmol/L Final   Glucose, Bld 04/01/2022 117 (H)  70 - 99 mg/dL Final   Glucose reference range applies only to samples taken after fasting for at least 8 hours.   BUN 04/01/2022 32 (H)  8 - 23 mg/dL Final   Creatinine, Ser 04/01/2022 0.88  0.44 - 1.00 mg/dL Final   Calcium 04/01/2022 10.0  8.9 - 10.3 mg/dL Final   GFR, Estimated 04/01/2022 >60  >60 mL/min Final   Comment: (NOTE) Calculated using the CKD-EPI Creatinine Equation (2021)    Anion gap 04/01/2022 7  5 - 15 Final   Performed at [facility], [address] [place], [place] [address]   WBC 04/01/2022 6.2  4.0 - 10.5 K/uL Final   RBC 04/01/2022 5.13 (H)  3.87 - 5.11 MIL/uL Final   Hemoglobin 04/01/2022 15.0  12.0 - 15.0 g/dL Final   HCT 04/01/2022 45.1  36.0 - 46.0 % Final   MCV 04/01/2022 87.9  80.0 - 100.0 fL Final   MCH 04/01/2022 29.2  26.0 - 34.0 pg Final   MCHC 04/01/2022 33.3  30.0 - 36.0 g/dL Final   RDW 04/01/2022 12.9  11.5 - 15.5 % Final   Platelets 04/01/2022 284  150 - 400 K/uL Final   nRBC 04/01/2022 0.0  0.0 - 0.2 % Final   Neutrophils Relative % 04/01/2022 43  % Final   Neutro Abs 04/01/2022 2.6  1.7 - 7.7 K/uL Final   Lymphocytes Relative 04/01/2022 48  % Final   Lymphs Abs 04/01/2022 3.0  0.7 - 4.0 K/uL Final   Monocytes Relative 04/01/2022 6  % Final   Monocytes Absolute 04/01/2022 0.4  0.1 - 1.0 K/uL Final   Eosinophils Relative 04/01/2022 1  % Final   Eosinophils Absolute 04/01/2022 0.1  0.0 - 0.5 K/uL Final   Basophils Relative 04/01/2022 2  % Final   Basophils Absolute 04/01/2022 0.1  0.0 - 0.1 K/uL Final   Immature Granulocytes 04/01/2022 0  % Final    Abs Immature Granulocytes 04/01/2022 0.01  0.00 - 0.07 K/uL Final   Performed at [facility], [address] [place], [place] [address]    ECG: Date: 05/21/2022 Time ECG obtained: 0923 AM Rate: 60 bpm Rhythm: normal sinus Axis (leads I and aVF): Normal Intervals: PR 164 ms.  QRS 72 ms. QTc 420 ms. ST segment and T wave changes: No evidence of acute ST segment elevation or depression Comparison: Similar to previous tracing obtained on 09/24/2020   IMAGING / PROCEDURES: DIAGNOSTIC RADIOGRAPHS OF LEFT KNEE 3 VIEWS performed on 02/27/2022 Good position of the total knee implants Good alignment is noted on the AP window Good cement mantle was appreciated without evidence of loosening No evidence of polyethylene wear or osteolysis No evidence of fracture or dislocation  DIAGNOSTIC RADIOGRAPHS OF RIGHT KNEE 3 VIEWS performed on 02/27/2022 Good position of the total knee implant. Good alignment is noted on the AP view Good cement mantle is appreciated without evidence of loosening No evidence of polyethylene wear or osteolysis No evidence of fracture or dislocation  TRANSTHORACIC ECHOCARDIOGRAM performed on 03/07/2021 Normal left ventricular systolic function with an EF of >55% Trivial MR and PR Mild TR No AR Normal gradients; no valvular stenosis No pericardial effusion  MYOCARDIAL PERFUSION IMAGING STUDY (LEXISCAN) performed on 05/04/2019 Normal left ventricular systolic function with an EF of 65% Normal myocardial thickening and wall motion No artifact Left ventricular cavity size normal No evidence of stress-induced myocardial ischemia or arrhythmia; no scintigraphic evidence of scar Normal low risk study  Impression and Plan:  YASMYN BELLISARIO has been referred for pre-anesthesia review and clearance prior to her undergoing the planned anesthetic and procedural courses. Available labs, pertinent testing, and imaging results were personally reviewed by me. This  patient has been appropriately cleared by cardiology with an overall LOW risk of significant perioperative cardiovascular complications.  Based on clinical review performed today (05/26/22), barring any significant acute changes in the patient's overall condition, it is anticipated that she will be able to proceed with the planned surgical intervention. Any acute changes in clinical condition may necessitate her procedure being postponed and/or cancelled. Patient will meet with anesthesia team (MD and/or CRNA) on the day of her procedure for preoperative evaluation/assessment. Questions regarding anesthetic course will be fielded at that time.   Pre-surgical instructions were reviewed with the patient during her PAT appointment and questions were fielded by PAT clinical staff. Patient was advised that if any questions or concerns arise prior to her procedure then she should return a call to PAT and/or her surgeon's office to discuss.  Honor Loh, MSN, APRN, FNP-C, CEN [facility]  Peri-operative Services Nurse Practitioner Phone: [phone] Fax: [phone] 05/26/22 6:08 PM  NOTE: This note has been prepared using Dragon dictation software. Despite my best ability to proofread, there is always the potential that unintentional transcriptional errors may still occur from this process.

## 2022-05-28 ENCOUNTER — Ambulatory Visit
Admission: RE | Admit: 2022-05-28 | Discharge: 2022-05-28 | Disposition: A | Discharge status: Home / Self Care | Payer: Medicare Other | Source: Ambulatory Visit | Attending: Orthopedic Surgery | Admitting: Orthopedic Surgery

## 2022-05-28 ENCOUNTER — Encounter
Admission: RE | Disposition: A | Discharge status: Home / Self Care | Payer: Self-pay | Source: Ambulatory Visit | Attending: Orthopedic Surgery

## 2022-05-28 ENCOUNTER — Other Ambulatory Visit: Payer: Self-pay

## 2022-05-28 ENCOUNTER — Encounter: Payer: Self-pay | Admitting: Orthopedic Surgery

## 2022-05-28 ENCOUNTER — Ambulatory Visit: Payer: Medicare Other | Admitting: Urgent Care

## 2022-05-28 DIAGNOSIS — G4733 Obstructive sleep apnea (adult) (pediatric): Secondary | ICD-10-CM | POA: Diagnosis not present

## 2022-05-28 DIAGNOSIS — E7800 Pure hypercholesterolemia, unspecified: Secondary | ICD-10-CM | POA: Diagnosis not present

## 2022-05-28 DIAGNOSIS — Z9889 Other specified postprocedural states: Secondary | ICD-10-CM

## 2022-05-28 DIAGNOSIS — T8482XA Fibrosis due to internal orthopedic prosthetic devices, implants and grafts, initial encounter: Secondary | ICD-10-CM | POA: Diagnosis not present

## 2022-05-28 DIAGNOSIS — E079 Disorder of thyroid, unspecified: Secondary | ICD-10-CM | POA: Insufficient documentation

## 2022-05-28 DIAGNOSIS — E669 Obesity, unspecified: Secondary | ICD-10-CM | POA: Diagnosis not present

## 2022-05-28 DIAGNOSIS — D369 Benign neoplasm, unspecified site: Secondary | ICD-10-CM | POA: Insufficient documentation

## 2022-05-28 DIAGNOSIS — X58XXXA Exposure to other specified factors, initial encounter: Secondary | ICD-10-CM | POA: Insufficient documentation

## 2022-05-28 DIAGNOSIS — M25562 Pain in left knee: Secondary | ICD-10-CM | POA: Diagnosis present

## 2022-05-28 DIAGNOSIS — I10 Essential (primary) hypertension: Secondary | ICD-10-CM | POA: Insufficient documentation

## 2022-05-28 DIAGNOSIS — K649 Unspecified hemorrhoids: Secondary | ICD-10-CM | POA: Insufficient documentation

## 2022-05-28 DIAGNOSIS — Z96653 Presence of artificial knee joint, bilateral: Secondary | ICD-10-CM | POA: Diagnosis not present

## 2022-05-28 DIAGNOSIS — Z6832 Body mass index (BMI) 32.0-32.9, adult: Secondary | ICD-10-CM | POA: Diagnosis not present

## 2022-05-28 DIAGNOSIS — Z8619 Personal history of other infectious and parasitic diseases: Secondary | ICD-10-CM | POA: Insufficient documentation

## 2022-05-28 DIAGNOSIS — E039 Hypothyroidism, unspecified: Secondary | ICD-10-CM | POA: Diagnosis not present

## 2022-05-28 DIAGNOSIS — M24662 Ankylosis, left knee: Secondary | ICD-10-CM | POA: Insufficient documentation

## 2022-05-28 DIAGNOSIS — K5790 Diverticulosis of intestine, part unspecified, without perforation or abscess without bleeding: Secondary | ICD-10-CM | POA: Insufficient documentation

## 2022-05-28 DIAGNOSIS — Z79899 Other long term (current) drug therapy: Secondary | ICD-10-CM | POA: Diagnosis not present

## 2022-05-28 DIAGNOSIS — K589 Irritable bowel syndrome without diarrhea: Secondary | ICD-10-CM | POA: Insufficient documentation

## 2022-05-28 HISTORY — PX: KNEE ARTHROSCOPY: SHX127

## 2022-05-28 SURGERY — ARTHROSCOPY, KNEE
Anesthesia: General | Site: Knee | Laterality: Left

## 2022-05-28 MED ORDER — PHENYLEPHRINE 80 MCG/ML (10ML) SYRINGE FOR IV PUSH (FOR BLOOD PRESSURE SUPPORT)
PREFILLED_SYRINGE | INTRAVENOUS | Status: DC | PRN
  Administered 2022-05-28: 160 ug via INTRAVENOUS

## 2022-05-28 MED ORDER — SCOPOLAMINE 1 MG/3DAYS TD PT72: 1.0000 | MEDICATED_PATCH | TRANSDERMAL | Status: DC

## 2022-05-28 MED ORDER — LIDOCAINE HCL (CARDIAC) PF 100 MG/5ML IV SOSY
PREFILLED_SYRINGE | INTRAVENOUS | Status: DC | PRN
  Administered 2022-05-28: 60 mg via INTRAVENOUS

## 2022-05-28 MED ORDER — MORPHINE SULFATE (PF) 4 MG/ML IV SOLN
INTRAVENOUS | Status: AC
  Filled 2022-05-28: qty 1

## 2022-05-28 MED ORDER — ACETAMINOPHEN 325 MG PO TABS: 325.0000 mg | ORAL_TABLET | Freq: Four times a day (QID) | ORAL | Status: DC | PRN

## 2022-05-28 MED ORDER — ORAL CARE MOUTH RINSE: 15.0000 mL | Freq: Once | OROMUCOSAL | Status: AC

## 2022-05-28 MED ORDER — LACTATED RINGERS IV SOLN: INTRAVENOUS | Status: DC

## 2022-05-28 MED ORDER — SODIUM CHLORIDE 0.9 % IV SOLN: INTRAVENOUS | Status: DC

## 2022-05-28 MED ORDER — OXYCODONE HCL 5 MG PO TABS
5.0000 mg | ORAL_TABLET | Freq: Once | ORAL | Status: AC | PRN
  Administered 2022-05-28: 5 mg via ORAL

## 2022-05-28 MED ORDER — EPHEDRINE SULFATE (PRESSORS) 50 MG/ML IJ SOLN
INTRAMUSCULAR | Status: DC | PRN
  Administered 2022-05-28: 10 mg via INTRAVENOUS
  Administered 2022-05-28: 5 mg via INTRAVENOUS
  Administered 2022-05-28: 10 mg via INTRAVENOUS

## 2022-05-28 MED ORDER — OXYCODONE HCL 5 MG PO TABS
ORAL_TABLET | ORAL | Status: AC
  Filled 2022-05-28: qty 1

## 2022-05-28 MED ORDER — FENTANYL CITRATE (PF) 100 MCG/2ML IJ SOLN
INTRAMUSCULAR | Status: AC
  Filled 2022-05-28: qty 2

## 2022-05-28 MED ORDER — FAMOTIDINE 20 MG PO TABS: 20.0000 mg | ORAL_TABLET | Freq: Once | ORAL | Status: AC

## 2022-05-28 MED ORDER — MORPHINE SULFATE (PF) 2 MG/ML IV SOLN: 0.5000 mg | INTRAVENOUS | Status: DC | PRN

## 2022-05-28 MED ORDER — BUPIVACAINE-EPINEPHRINE (PF) 0.25% -1:200000 IJ SOLN
INTRAMUSCULAR | Status: AC
  Filled 2022-05-28: qty 30

## 2022-05-28 MED ORDER — SCOPOLAMINE 1 MG/3DAYS TD PT72
MEDICATED_PATCH | TRANSDERMAL | Status: AC
  Administered 2022-05-28: 1.5 mg via TRANSDERMAL
  Filled 2022-05-28: qty 1

## 2022-05-28 MED ORDER — HYDROCODONE-ACETAMINOPHEN 7.5-325 MG PO TABS: 1.0000 | ORAL_TABLET | ORAL | Status: DC | PRN

## 2022-05-28 MED ORDER — CEFAZOLIN SODIUM-DEXTROSE 2-4 GM/100ML-% IV SOLN
INTRAVENOUS | Status: AC
  Filled 2022-05-28: qty 100

## 2022-05-28 MED ORDER — ONDANSETRON HCL 4 MG/2ML IJ SOLN: 4.0000 mg | Freq: Once | INTRAMUSCULAR | Status: DC | PRN

## 2022-05-28 MED ORDER — LACTATED RINGERS IV SOLN: INTRAVENOUS | Status: DC | PRN

## 2022-05-28 MED ORDER — CHLORHEXIDINE GLUCONATE 0.12 % MT SOLN
OROMUCOSAL | Status: AC
  Administered 2022-05-28: 15 mL via OROMUCOSAL
  Filled 2022-05-28: qty 15

## 2022-05-28 MED ORDER — METOCLOPRAMIDE HCL 5 MG/ML IJ SOLN: 5.0000 mg | Freq: Three times a day (TID) | INTRAMUSCULAR | Status: DC | PRN

## 2022-05-28 MED ORDER — ACETAMINOPHEN 10 MG/ML IV SOLN
INTRAVENOUS | Status: DC | PRN
  Administered 2022-05-28: 1000 mg via INTRAVENOUS

## 2022-05-28 MED ORDER — MIDAZOLAM HCL 2 MG/2ML IJ SOLN
INTRAMUSCULAR | Status: AC
Start: 2022-05-28 — End: ?
  Filled 2022-05-28: qty 2

## 2022-05-28 MED ORDER — CHLORHEXIDINE GLUCONATE 0.12 % MT SOLN: 15.0000 mL | Freq: Once | OROMUCOSAL | Status: AC

## 2022-05-28 MED ORDER — RINGERS IRRIGATION IR SOLN
Status: DC | PRN
  Administered 2022-05-28: 6000 mL
  Administered 2022-05-28 (×4): 3000 mL
  Administered 2022-05-28: 6000 mL

## 2022-05-28 MED ORDER — HYDROCODONE-ACETAMINOPHEN 5-325 MG PO TABS: 1.0000 | ORAL_TABLET | ORAL | Status: DC | PRN

## 2022-05-28 MED ORDER — ACETAMINOPHEN 10 MG/ML IV SOLN: 1000.0000 mg | Freq: Once | INTRAVENOUS | Status: DC | PRN

## 2022-05-28 MED ORDER — ONDANSETRON HCL 4 MG/2ML IJ SOLN: 4.0000 mg | Freq: Four times a day (QID) | INTRAMUSCULAR | Status: DC | PRN

## 2022-05-28 MED ORDER — HYDROCODONE-ACETAMINOPHEN 5-325 MG PO TABS: 1.0000 | ORAL_TABLET | Freq: Four times a day (QID) | ORAL | 0 refills | Status: DC | PRN

## 2022-05-28 MED ORDER — FENTANYL CITRATE (PF) 100 MCG/2ML IJ SOLN: 25.0000 ug | INTRAMUSCULAR | Status: DC | PRN

## 2022-05-28 MED ORDER — DEXAMETHASONE SODIUM PHOSPHATE 10 MG/ML IJ SOLN
INTRAMUSCULAR | Status: DC | PRN
  Administered 2022-05-28: 8 mg via INTRAVENOUS

## 2022-05-28 MED ORDER — OXYCODONE HCL 5 MG/5ML PO SOLN: 5.0000 mg | Freq: Once | ORAL | Status: AC | PRN

## 2022-05-28 MED ORDER — FAMOTIDINE 20 MG PO TABS
ORAL_TABLET | ORAL | Status: AC
  Administered 2022-05-28: 20 mg via ORAL
  Filled 2022-05-28: qty 1

## 2022-05-28 MED ORDER — CEFAZOLIN SODIUM-DEXTROSE 2-4 GM/100ML-% IV SOLN
2.0000 g | INTRAVENOUS | Status: AC
  Administered 2022-05-28: 2 g via INTRAVENOUS

## 2022-05-28 MED ORDER — ONDANSETRON HCL 4 MG/2ML IJ SOLN
INTRAMUSCULAR | Status: DC | PRN
  Administered 2022-05-28: 4 mg via INTRAVENOUS

## 2022-05-28 MED ORDER — ONDANSETRON HCL 4 MG PO TABS: 4.0000 mg | ORAL_TABLET | Freq: Four times a day (QID) | ORAL | Status: DC | PRN

## 2022-05-28 MED ORDER — FENTANYL CITRATE (PF) 100 MCG/2ML IJ SOLN
INTRAMUSCULAR | Status: DC | PRN
Start: 2022-05-28 — End: 2022-05-28
  Administered 2022-05-28: 100 ug via INTRAVENOUS
  Administered 2022-05-28: 50 ug via INTRAVENOUS
  Administered 2022-05-28 (×2): 25 ug via INTRAVENOUS

## 2022-05-28 MED ORDER — BUPIVACAINE-EPINEPHRINE 0.25% -1:200000 IJ SOLN
INTRAMUSCULAR | Status: DC | PRN
  Administered 2022-05-28: 5 mL

## 2022-05-28 MED ORDER — ACETAMINOPHEN 10 MG/ML IV SOLN
INTRAVENOUS | Status: AC
  Filled 2022-05-28: qty 100

## 2022-05-28 MED ORDER — PROPOFOL 10 MG/ML IV BOLUS
INTRAVENOUS | Status: DC | PRN
  Administered 2022-05-28: 100 mg via INTRAVENOUS
  Administered 2022-05-28: 200 mg via INTRAVENOUS

## 2022-05-28 MED ORDER — MIDAZOLAM HCL 2 MG/2ML IJ SOLN
INTRAMUSCULAR | Status: DC | PRN
  Administered 2022-05-28: 2 mg via INTRAVENOUS

## 2022-05-28 MED ORDER — MORPHINE SULFATE 4 MG/ML IJ SOLN
INTRAMUSCULAR | Status: DC | PRN
  Administered 2022-05-28: 29 mL via INTRA_ARTICULAR

## 2022-05-28 MED ORDER — METOCLOPRAMIDE HCL 10 MG PO TABS: 5.0000 mg | ORAL_TABLET | Freq: Three times a day (TID) | ORAL | Status: DC | PRN

## 2022-05-28 SURGICAL SUPPLY — 36 items
ADAPTER IRRIG TUBE 2 SPIKE SOL (ADAPTER) ×4 IMPLANT
ADPR TBG 2 SPK PMP STRL ASCP (ADAPTER) ×2
BLADE SHAVER 4.5 DBL SERAT CV (CUTTER) ×1 IMPLANT
BNDG CMPR STD VLCR NS LF 5.8X6 (GAUZE/BANDAGES/DRESSINGS) ×1
BNDG ELASTIC 6X5.8 VLCR NS LF (GAUZE/BANDAGES/DRESSINGS) ×2 IMPLANT
DRAPE ARTHRO LIMB 89X125 STRL (DRAPES) ×2 IMPLANT
DRSG DERMACEA 8X12 NADH (GAUZE/BANDAGES/DRESSINGS) ×1 IMPLANT
DRSG DERMACEA NONADH 3X8 (GAUZE/BANDAGES/DRESSINGS) ×2 IMPLANT
DURAPREP 26ML APPLICATOR (WOUND CARE) ×2 IMPLANT
GAUZE SPONGE 4X4 12PLY STRL (GAUZE/BANDAGES/DRESSINGS) ×2 IMPLANT
GLOVE BIOGEL M STRL SZ7.5 (GLOVE) ×2 IMPLANT
GLOVE SURG UNDER LTX SZ8 (GLOVE) ×2 IMPLANT
GOWN STRL REUS W/ TWL LRG LVL3 (GOWN DISPOSABLE) ×2 IMPLANT
GOWN STRL REUS W/TWL LRG LVL3 (GOWN DISPOSABLE) ×4
IV LACTATED RINGER IRRG 3000ML (IV SOLUTION) ×16
IV LR IRRIG 3000ML ARTHROMATIC (IV SOLUTION) ×2 IMPLANT
KIT TURNOVER KIT A (KITS) ×2 IMPLANT
MANIFOLD NEPTUNE II (INSTRUMENTS) ×3 IMPLANT
PACK ARTHROSCOPY KNEE (MISCELLANEOUS) ×2 IMPLANT
PADDING CAST 6X4YD NS (MISCELLANEOUS) ×1
PADDING CAST COTTON 6X4 NS (MISCELLANEOUS) ×1 IMPLANT
PADDING CAST SYNTHETIC 4 (CAST SUPPLIES) ×1
PADDING CAST SYNTHETIC 4X4 STR (CAST SUPPLIES) IMPLANT
SHAVER BLADE TAPERED BLUNT 4 (BLADE) ×2 IMPLANT
SOL PREP PVP 2OZ (MISCELLANEOUS) ×2
SOLUTION PREP PVP 2OZ (MISCELLANEOUS) ×1 IMPLANT
SPONGE T-LAP 18X18 ~~LOC~~+RFID (SPONGE) ×2 IMPLANT
STOCKINETTE BIAS CUT 6 980064 (GAUZE/BANDAGES/DRESSINGS) ×1 IMPLANT
SUT ETHILON 3-0 FS-10 30 BLK (SUTURE) ×2
SUTURE EHLN 3-0 FS-10 30 BLK (SUTURE) ×1 IMPLANT
TAPE PAPER 2X10 WHT MICROPORE (GAUZE/BANDAGES/DRESSINGS) ×1 IMPLANT
TUBING INFLOW SET DBFLO PUMP (TUBING) ×2 IMPLANT
TUBING OUTFLOW SET DBLFO PUMP (TUBING) ×2 IMPLANT
WAND 4.6 50D HIPVAC 50 IFS (SURGICAL WAND) ×2 IMPLANT
WATER STERILE IRR 500ML POUR (IV SOLUTION) ×2 IMPLANT
WRAP KNEE W/COLD PACKS 25.5X14 (SOFTGOODS) ×2 IMPLANT

## 2022-05-28 NOTE — Transfer of Care (Addendum)
Immediate Anesthesia Transfer of Care Note  Patient: Denise Hunter  Procedure(s) Performed: ARTHROSCOPY KNEE WITH LYSIS OF ADHESIONS (Left: Knee)  Patient Location: PACU  Anesthesia Type:General  Level of Consciousness: drowsy  Airway & Oxygen Therapy: Patient Spontanous Breathing and Patient connected to face mask oxygen  Post-op Assessment: Report given to RN and Post -op Vital signs reviewed and stable  Post vital signs: Reviewed and stable  Last Vitals:  Vitals Value Taken Time  BP    Temp    Pulse    Resp    SpO2      Last Pain:  Vitals:   05/28/22 1408  TempSrc: Oral  PainSc: 3          Complications: No notable events documented.

## 2022-05-28 NOTE — Anesthesia Postprocedure Evaluation (Signed)
Anesthesia Post Note  Patient: Denise Hunter  Procedure(s) Performed: ARTHROSCOPY KNEE WITH LYSIS OF ADHESIONS (Left: Knee)  Patient location during evaluation: PACU Anesthesia Type: General Level of consciousness: awake and alert Pain management: pain level controlled Vital Signs Assessment: post-procedure vital signs reviewed and stable Respiratory status: spontaneous breathing, nonlabored ventilation, respiratory function stable and patient connected to nasal cannula oxygen Cardiovascular status: blood pressure returned to baseline and stable Postop Assessment: no apparent nausea or vomiting Anesthetic complications: no   No notable events documented.   Last Vitals:  Vitals:   05/28/22 1900 05/28/22 1914  BP: (!) 161/82 (!) 142/92  Pulse: 85 88  Resp:    Temp:    SpO2: 97%     Last Pain:  Vitals:   05/28/22 1845  TempSrc:   PainSc: 5                  Arita Miss

## 2022-05-28 NOTE — Op Note (Signed)
OPERATIVE NOTE  DATE OF SURGERY:  05/28/2022  PATIENT NAME:  Denise Hunter   DOB: 01-Oct-1951  MRN: [id number]   PRE-OPERATIVE DIAGNOSIS: Arthrofibrosis of the left knee, status post left total knee arthroplasty  POST-OPERATIVE DIAGNOSIS: Same  PROCEDURE:  Left knee arthroscopy, extensive lysis of adhesions   SURGEON:  Marciano Sequin., M.D.   ASSISTANT: none  ANESTHESIA: general  ESTIMATED BLOOD LOSS: Minimal  FLUIDS REPLACED: 1400 mL of crystalloid  TOURNIQUET TIME: Not used  INDICATIONS FOR SURGERY: Denise Hunter is a 71 y.o. year old female who underwent left total knee arthroplasty and 2009.  She had difficulty with gaining her range of motion and developed significant arthrofibrosis of the left knee.  Recent examination demonstrated only 55 degrees of flexion.  The patient was having difficulty with ambulation due to the limited range of motion. After discussion of the risks and benefits of surgical intervention, the patient expressed understanding of the risks benefits and agree with plans for left knee arthroscopy.   PROCEDURE IN DETAIL: The patient was brought into the operating room and, after adequate general anesthesia was achieved, a tourniquet was applied to the left thigh and the leg was placed in the leg holder. All bony prominences were well padded. The patient's left knee was cleaned and prepped with alcohol and Duraprep and draped in the usual sterile fashion. A "timeout" was performed as per usual protocol. The anticipated portal sites were injected with 0.25% Marcaine with epinephrine. An anterolateral incision was made and a cannula was inserted.  Extensive fibrotic tissue was encountered and was obscuring visualization of the implants.  An anteromedial portal was created and a cannula was inserted.  Using triangulation techniques, the tip of the trocar was visualized with the scope.  An incisor shaver was subsequently inserted to begin debriding some of the  fibrotic tissue in the area of the notch and the anterior recess.  Debridement continued along the anterior aspect of the implants using combination of the hip VAC ArthroCare wand and the shaver.  Deliberate debridement was continued to the anteromedial corner and then into the medial gutter.  Again, the gutter was completely obliterated with the scar tissue and debridement of the tissue required combination of both mechanical means with the punches and shaver as well as with the wand.  Next, the scope was removed from the anterolateral portal and reinserted via the anteromedial portal.  This allowed for better visualization of the anterolateral corner.  Again, dense fibrotic tissue was encountered.  This was debrided with continuation of the debridement into the lateral gutter.  This allowed for visualization of the femoral and tibial implants.  However, the patella was completely covered by the fibrotic tissue.  The ArthroCare wand was carefully used to debride the fibrotic tissue from the parapatellar region.  The patella was noted to be in good condition.  Fibrotic tissue was also visualized in the suprapatellar pouch.  The pouch was debrided and cleared of fibrotic tissue using a combination of the ArthroCare wand and the shaver.  The knee was placed range of motion with good patellar tracking noted.  Approximately 100 degrees of flexion was subsequently achieved.   The knee was irrigated with copius amounts of fluid and suctioned dry. The anterolateral portal was re-approximated with #3-0 nylon. A combination of 0.25% Marcaine with epinephrine and 4 mg of Morphine were injected via the scope. The scope was removed and the anteromedial portal was re-approximated with #3-0 nylon. A sterile dressing was  applied followed by application of an ice wrap.  The patient tolerated the procedure well and was transported to the PACU in stable condition.  Denise Hunter., M.D.

## 2022-05-28 NOTE — Anesthesia Preprocedure Evaluation (Addendum)
Anesthesia Evaluation  Patient identified by MRN, date of birth, ID band Patient awake    Reviewed: Allergy & Precautions, NPO status , Patient's Chart, lab work & pertinent test results  History of Anesthesia Complications (+) PONV and history of anesthetic complications  Airway Mallampati: III   Neck ROM: Full    Dental no notable dental hx.    Pulmonary sleep apnea and Continuous Positive Airway Pressure Ventilation ,    Pulmonary exam normal breath sounds clear to auscultation       Cardiovascular hypertension, Normal cardiovascular exam+ Valvular Problems/Murmurs MVP  Rhythm:Regular Rate:Normal  ECG 05/21/22: normal  Echo 03/07/21:  NORMAL LEFT VENTRICULAR SYSTOLIC FUNCTION  NORMAL RIGHT VENTRICULAR SYSTOLIC FUNCTION  MILD VALVULAR REGURGITATION  NO VALVULAR STENOSIS  TRIVIAL MR, PR  MILD TR  EF 55%  Closest EF: >55% (Estimated)  Mitral: TRIVIAL MR  Tricuspid: MILD TR   Myocardial perfusion 05/04/19:  1. Normal left ventricular systolic function with an EF of 65% 2. Normal myocardial thickening and wall motion 3. No artifact 4. Left ventricular cavity size normal 5. No evidence of stress-induced myocardial ischemia or arrhythmia; no scintigraphic evidence of scar 6. Normal low risk study   Neuro/Psych PSYCHIATRIC DISORDERS Anxiety negative neurological ROS     GI/Hepatic negative GI ROS,   Endo/Other  Hypothyroidism Obesity   Renal/GU Renal disease (nephrolithiasis)     Musculoskeletal   Abdominal   Peds  Hematology negative hematology ROS (+)   Anesthesia Other Findings Cardiology note 04/29/22:  71 y.o. female with  1. Bilateral carotid bruits  2. Atypical chest pain  3. Primary hypertension  4. Mitral valve prolapse syndrome  5. OSA on CPAP  6. Pure hypercholesterolemia  7. Class 1 obesity due to excess calories without serious comorbidity with body mass index (BMI) of 32.0 to 32.9 in adult    Plan  1 obstructive sleep apnea by history recommend sleep study CPAP follow-up with pulmonary 2 atypical chest pain reasonably stable at this point recommend conservative management 3 primary hypertension with reasonable control continue conservative medical therapy with HCTZ 4 mitral valve prolapse by history would consider recommend echocardiogram for further assessment continue conservative management 5 hyperlipidemia continue diet and exercise consider statin therapy 6 mild obesity recommend modest weight loss exercise portion control 7 have the patient follow-up in 6 months  Return in about 6 months (around 10/30/2022).   Reproductive/Obstetrics Uterine CA                            Anesthesia Physical Anesthesia Plan  ASA: 2  Anesthesia Plan: General   Post-op Pain Management:    Induction: Intravenous  PONV Risk Score and Plan: 4 or greater and Ondansetron, Dexamethasone, Treatment may vary due to age or medical condition and Scopolamine patch - Pre-op  Airway Management Planned: LMA  Additional Equipment:   Intra-op Plan:   Post-operative Plan: Extubation in OR  Informed Consent: I have reviewed the patients History and Physical, chart, labs and discussed the procedure including the risks, benefits and alternatives for the proposed anesthesia with the patient or authorized representative who has indicated his/her understanding and acceptance.     Dental advisory given  Plan Discussed with: CRNA  Anesthesia Plan Comments: (Patient consented for risks of anesthesia including but not limited to:  - adverse reactions to medications - damage to eyes, teeth, lips or other oral mucosa - nerve damage due to positioning  - sore throat or  hoarseness - damage to heart, brain, nerves, lungs, other parts of body or loss of life  Informed patient about role of CRNA in peri- and intra-operative care.  Patient voiced understanding.)         Anesthesia Quick Evaluation

## 2022-05-28 NOTE — H&P (Signed)
The patient has been re-examined, and the chart reviewed, and there have been no interval changes to the documented history and physical.    The risks, benefits, and alternatives have been discussed at length. The patient expressed understanding of the risks benefits and agreed with plans for surgical intervention.  Jaclin Finks P. Antonina Deziel, Jr. M.D.    

## 2022-05-29 ENCOUNTER — Encounter: Payer: Self-pay | Admitting: Orthopedic Surgery

## 2022-06-18 ENCOUNTER — Encounter: Payer: Self-pay | Admitting: Gastroenterology

## 2022-06-18 NOTE — H&P (Signed)
Pre-Procedure H&P   Patient ID: Denise Hunter is a 71 y.o. female.  Gastroenterology Provider: Annamaria Helling, DO  Referring Provider: Laurine Blazer, PA PCP: Tracie Harrier, MD  Date: 06/19/2022  HPI [name] is a 71 y.o. female who presents today for Esophagogastroduodenoscopy for Dysphagia;   Patient has noted some solid food dysphagia without dysphagia to liquids and pills.  She denies any odynophagia.  Weight and appetite have been stable. Feels that things are "moving down slowly."  Initially scheduled for colonoscopy today, however, little BM with prep. Patient has had left lower quadrant pain associated with diverticulitis episodes (March).  This improved with antibiotics.  Most recent CAT scan in August 2022 demonstrating sigmoid diverticulitis  Last colonoscopy in 2013 demonstrating internal hemorrhoids  Most recent lab work: Hemoglobin 14.5 MCV 89 platelets 204,000 creatinine 0.9  Patient is status post hysterectomy thyroidectomy and appendectomy  Notably with bilateral carotid bruits and mitral valve prolapse.  Obstructive sleep apnea with CPAP use   Past Medical History:  Diagnosis Date   Adenomatous polyps    Anemia    Anxiety    a.) on BZO (alprazolam) PRN   Atypical chest pain    Bilateral carotid bruits    Chronic kidney disease    Diastolic dysfunction 62/13/0865   a.) TTE 02/20/2018: EF > 55%; mild LAE; G1DD.   Diverticulosis    Hemorrhoids    History of COVID-19 12/19/2020   HLD (hyperlipidemia)    Hypothyroidism    IBS (irritable bowel syndrome)    Lymphocytosis    Mitral valve prolapse syndrome    Nephrolithiasis    OSA on CPAP    Osteoporosis    PONV (postoperative nausea and vomiting)    Primary hypertension    Uterine cancer (HCC)    Valvular heart disease    a.) TTE 07/06/2015: EF >55%; mild LVH; mild LAE, mild MR/TR/PR. b.) TTE 08/13/2016: EF >55%; triv MR, mild TR. c.) TTE 02/20/2018: EF 55%; mild LAE, triv  PR, mild MR, mod TR. d.) TTE 05/04/2019: EF >55%; LVH; mild MR/TR/PR. e.) TTE 03/07/2021: EF >55%, triv MR/PR, mild TR.    Past Surgical History:  Procedure Laterality Date   ABDOMINAL HYSTERECTOMY     BREAST CYST ASPIRATION     years ago   JOINT REPLACEMENT     KNEE ARTHROSCOPY Left 05/28/2022   Procedure: ARTHROSCOPY KNEE WITH LYSIS OF ADHESIONS;  Surgeon: Dereck Leep, MD;  Location: [facility] ORS;  Service: Orthopedics;  Laterality: Left;   KNEE SURGERY     THYROID SURGERY      Family History No h/o GI disease or malignancy  Review of Systems  Constitutional:  Negative for activity change, appetite change, chills, diaphoresis, fatigue, fever and unexpected weight change.  HENT:  Positive for trouble swallowing (solids). Negative for voice change.   Respiratory:  Negative for shortness of breath and wheezing.   Cardiovascular:  Negative for chest pain, palpitations and leg swelling.  Gastrointestinal:  Positive for abdominal pain (llq - not currently). Negative for abdominal distention, anal bleeding, blood in stool, constipation, diarrhea, nausea, rectal pain and vomiting.  Musculoskeletal:  Negative for arthralgias and myalgias.  Skin:  Negative for color change and pallor.  Neurological:  Negative for dizziness, syncope and weakness.  Psychiatric/Behavioral:  Negative for confusion.   All other systems reviewed and are negative.    Medications No current facility-administered medications on file prior to encounter.   Current Outpatient Medications on File Prior  to Encounter  Medication Sig Dispense Refill   ALPRAZolam (XANAX) 0.5 MG tablet Take 0.5 mg by mouth 3 (three) times daily as needed for anxiety.     ezetimibe (ZETIA) 10 MG tablet Take 10 mg by mouth daily.     levothyroxine (SYNTHROID) 88 MCG tablet Take 88 mcg by mouth daily. Take on an empty stomach with a glass of water at least 30-60 minutes before breakfast     telmisartan (MICARDIS) 40 MG tablet Take 1 tablet  by mouth daily.     Calcium Carbonate-Vitamin D 600-200 MG-UNIT TABS Take 1 tablet by mouth in the morning and at bedtime.     hydrochlorothiazide (HYDRODIURIL) 25 MG tablet      nitroGLYCERIN (NITROSTAT) 0.4 MG SL tablet Place 0.4 mg under the tongue as needed.      Pertinent medications related to GI and procedure were reviewed by me with the patient prior to the procedure   Current Facility-Administered Medications:    0.9 %  sodium chloride infusion, , Intravenous, Continuous, Annamaria Helling, DO      Allergies  Allergen Reactions   Amitriptyline Palpitations    Chest pain- sent to Acute Care with angina   Bisphosphonates Diarrhea   Celecoxib Other (See Comments)   Morphine Nausea Only   Nsaids     arrythmias   Tramadol Hypertension   Allergies were reviewed by me prior to the procedure  Objective   Body mass index is 29.93 kg/m. Vitals:   06/19/22 0757  BP: 123/72  Pulse: 62  Resp: 16  Temp: (!) 97 F (36.1 C)  TempSrc: Temporal  SpO2: 100%  Weight: 73 kg  Height: 5' 1.5" (1.562 m)    Physical Exam Vitals and nursing note reviewed.  Constitutional:      General: She is not in acute distress.    Appearance: Normal appearance. She is not ill-appearing, toxic-appearing or diaphoretic.  HENT:     Head: Normocephalic and atraumatic.     Nose: Nose normal.     Mouth/Throat:     Mouth: Mucous membranes are moist.     Pharynx: Oropharynx is clear.  Eyes:     General: No scleral icterus.    Extraocular Movements: Extraocular movements intact.  Cardiovascular:     Rate and Rhythm: Normal rate and regular rhythm.     Heart sounds: Murmur heard.     No friction rub. No gallop.  Pulmonary:     Effort: Pulmonary effort is normal. No respiratory distress.     Breath sounds: Normal breath sounds. No wheezing, rhonchi or rales.  Abdominal:     General: Bowel sounds are normal. There is no distension.     Palpations: Abdomen is soft.     Tenderness: There  is no abdominal tenderness. There is no guarding or rebound.  Musculoskeletal:     Cervical back: Neck supple.     Right lower leg: No edema.     Left lower leg: No edema.  Skin:    General: Skin is warm and dry.     Coloration: Skin is not jaundiced or pale.  Neurological:     General: No focal deficit present.     Mental Status: She is alert and oriented to person, place, and time. Mental status is at baseline.  Psychiatric:        Mood and Affect: Mood normal.        Behavior: Behavior normal.        Thought Content: Thought  content normal.        Judgment: Judgment normal.      Assessment:  Ms. SAQUOIA SIANEZ is a 71 y.o. female  who presents today for Esophagogastroduodenoscopy for Dysphagia;   Plan:  Esophagogastroduodenoscopy with possible intervention today  Esophagogastroduodenoscopy with possible biopsy, control of bleeding, polypectomy, and interventions as necessary has been discussed with the patient/patient representative. Informed consent was obtained from the patient/patient representative after explaining the indication, nature, and risks of the procedure including but not limited to death, bleeding, perforation, missed neoplasm/lesions, cardiorespiratory compromise, and reaction to medications. Opportunity for questions was given and appropriate answers were provided. Patient/patient representative has verbalized understanding is amenable to undergoing the procedure.   Annamaria Helling, DO  [facility] Gastroenter[facility]  Portions of the record may have been created with voice recognition software. Occasional wrong-word or 'sound-a-like' substitutions may have occurred due to the inherent limitations of voice recognition software.  Read the chart carefully and recognize, using context, where substitutions may have occurred.

## 2022-06-19 ENCOUNTER — Encounter: Payer: Self-pay | Admitting: Gastroenterology

## 2022-06-19 ENCOUNTER — Ambulatory Visit
Admission: RE | Admit: 2022-06-19 | Discharge: 2022-06-19 | Disposition: A | Discharge status: Home / Self Care | Payer: Medicare Other | Attending: Gastroenterology | Admitting: Gastroenterology

## 2022-06-19 ENCOUNTER — Ambulatory Visit: Payer: Medicare Other | Admitting: Urgent Care

## 2022-06-19 ENCOUNTER — Other Ambulatory Visit: Payer: Self-pay

## 2022-06-19 ENCOUNTER — Encounter
Admission: RE | Disposition: A | Discharge status: Home / Self Care | Payer: Self-pay | Source: Home / Self Care | Attending: Gastroenterology

## 2022-06-19 DIAGNOSIS — Z6829 Body mass index (BMI) 29.0-29.9, adult: Secondary | ICD-10-CM | POA: Diagnosis not present

## 2022-06-19 DIAGNOSIS — I341 Nonrheumatic mitral (valve) prolapse: Secondary | ICD-10-CM | POA: Insufficient documentation

## 2022-06-19 DIAGNOSIS — Z8719 Personal history of other diseases of the digestive system: Secondary | ICD-10-CM | POA: Insufficient documentation

## 2022-06-19 DIAGNOSIS — I129 Hypertensive chronic kidney disease with stage 1 through stage 4 chronic kidney disease, or unspecified chronic kidney disease: Secondary | ICD-10-CM | POA: Insufficient documentation

## 2022-06-19 DIAGNOSIS — E669 Obesity, unspecified: Secondary | ICD-10-CM | POA: Insufficient documentation

## 2022-06-19 DIAGNOSIS — K2970 Gastritis, unspecified, without bleeding: Secondary | ICD-10-CM | POA: Diagnosis not present

## 2022-06-19 DIAGNOSIS — G4733 Obstructive sleep apnea (adult) (pediatric): Secondary | ICD-10-CM | POA: Diagnosis not present

## 2022-06-19 DIAGNOSIS — N189 Chronic kidney disease, unspecified: Secondary | ICD-10-CM | POA: Diagnosis not present

## 2022-06-19 DIAGNOSIS — K449 Diaphragmatic hernia without obstruction or gangrene: Secondary | ICD-10-CM | POA: Diagnosis not present

## 2022-06-19 DIAGNOSIS — K2100 Gastro-esophageal reflux disease with esophagitis, without bleeding: Secondary | ICD-10-CM | POA: Insufficient documentation

## 2022-06-19 DIAGNOSIS — R0989 Other specified symptoms and signs involving the circulatory and respiratory systems: Secondary | ICD-10-CM | POA: Insufficient documentation

## 2022-06-19 DIAGNOSIS — Z8542 Personal history of malignant neoplasm of other parts of uterus: Secondary | ICD-10-CM | POA: Diagnosis not present

## 2022-06-19 DIAGNOSIS — K2289 Other specified disease of esophagus: Secondary | ICD-10-CM | POA: Insufficient documentation

## 2022-06-19 DIAGNOSIS — E039 Hypothyroidism, unspecified: Secondary | ICD-10-CM | POA: Diagnosis not present

## 2022-06-19 DIAGNOSIS — R1310 Dysphagia, unspecified: Secondary | ICD-10-CM | POA: Insufficient documentation

## 2022-06-19 HISTORY — DX: Benign neoplasm, unspecified site: D36.9

## 2022-06-19 HISTORY — PX: ESOPHAGOGASTRODUODENOSCOPY: SHX5428

## 2022-06-19 HISTORY — DX: Hyperlipidemia, unspecified: E78.5

## 2022-06-19 HISTORY — DX: Unspecified hemorrhoids: K64.9

## 2022-06-19 HISTORY — DX: Diverticulosis of intestine, part unspecified, without perforation or abscess without bleeding: K57.90

## 2022-06-19 HISTORY — DX: Malignant neoplasm of uterus, part unspecified: C55

## 2022-06-19 HISTORY — DX: Endocarditis, valve unspecified: I38

## 2022-06-19 HISTORY — DX: Other chest pain: R07.89

## 2022-06-19 HISTORY — DX: Anemia, unspecified: D64.9

## 2022-06-19 HISTORY — DX: Irritable bowel syndrome, unspecified: K58.9

## 2022-06-19 HISTORY — DX: Calculus of kidney: N20.0

## 2022-06-19 HISTORY — DX: Other specified symptoms and signs involving the circulatory and respiratory systems: R09.89

## 2022-06-19 HISTORY — DX: Anxiety disorder, unspecified: F41.9

## 2022-06-19 SURGERY — EGD (ESOPHAGOGASTRODUODENOSCOPY)
Anesthesia: General

## 2022-06-19 MED ORDER — SODIUM CHLORIDE 0.9 % IV SOLN: INTRAVENOUS | Status: DC

## 2022-06-19 MED ORDER — PROPOFOL 500 MG/50ML IV EMUL
INTRAVENOUS | Status: DC | PRN
  Administered 2022-06-19: 150 ug/kg/min via INTRAVENOUS

## 2022-06-19 MED ORDER — LIDOCAINE HCL (CARDIAC) PF 100 MG/5ML IV SOSY
PREFILLED_SYRINGE | INTRAVENOUS | Status: DC | PRN
  Administered 2022-06-19: 50 mg via INTRAVENOUS

## 2022-06-19 MED ORDER — PROPOFOL 10 MG/ML IV BOLUS
INTRAVENOUS | Status: DC | PRN
  Administered 2022-06-19: 20 mg via INTRAVENOUS
  Administered 2022-06-19: 50 mg via INTRAVENOUS

## 2022-06-19 NOTE — Op Note (Signed)
[facility] Gastroenterology Patient Name: Denise Hunter Procedure Date: 06/19/2022 8:11 AM MRN: [id number] Account #: [id number] Date of Birth: [birth year] Admit Type: Outpatient Age: 71 Room: [facility] ENDO ROOM 1 Gender: Female Note Status: Finalized Instrument Name: Upper Endoscope [id number] Procedure:             Upper GI endoscopy Indications:           Dysphagia Providers:             Annamaria Helling DO, DO Referring MD:          Tracie Harrier, MD (Referring MD) Medicines:             Monitored Anesthesia Care Complications:         No immediate complications. Estimated blood loss:                         Minimal. Procedure:             Pre-Anesthesia Assessment:                        - Prior to the procedure, a History and Physical was                         performed, and patient medications and allergies were                         reviewed. The patient is competent. The risks and                         benefits of the procedure and the sedation options and                         risks were discussed with the patient. All questions                         were answered and informed consent was obtained.                         Patient identification and proposed procedure were                         verified by the physician, the nurse, the anesthetist                         and the technician in the endoscopy suite. Mental                         Status Examination: alert and oriented. Airway                         Examination: normal oropharyngeal airway and neck                         mobility. Respiratory Examination: clear to                         auscultation. CV Examination: systolic murmur.  Prophylactic Antibiotics: The patient does not require                         prophylactic antibiotics. Prior Anticoagulants: The                         patient has taken no previous anticoagulant or                          antiplatelet agents. ASA Grade Assessment: II - A                         patient with mild systemic disease. After reviewing                         the risks and benefits, the patient was deemed in                         satisfactory condition to undergo the procedure. The                         anesthesia plan was to use monitored anesthesia care                         (MAC). Immediately prior to administration of                         medications, the patient was re-assessed for adequacy                         to receive sedatives. The heart rate, respiratory                         rate, oxygen saturations, blood pressure, adequacy of                         pulmonary ventilation, and response to care were                         monitored throughout the procedure. The physical                         status of the patient was re-assessed after the                         procedure.                        After obtaining informed consent, the endoscope was                         passed under direct vision. Throughout the procedure,                         the patient's blood pressure, pulse, and oxygen                         saturations were monitored continuously. The Endoscope  was introduced through the mouth, and advanced to the                         second part of duodenum. The upper GI endoscopy was                         accomplished without difficulty. The patient tolerated                         the procedure well. Findings:      The ampulla, duodenal bulb, first portion of the duodenum and second       portion of the duodenum were normal. Estimated blood loss: none.      Localized mild inflammation characterized by erythema was found in the       gastric antrum. Biopsies were taken with a cold forceps for Helicobacter       pylori testing. Estimated blood loss was minimal.      A small hiatal hernia was present. Estimated blood loss:  none.      The exam of the stomach was otherwise normal.      LA Grade A (one or more mucosal breaks less than 5 mm, not extending       between tops of 2 mucosal folds) esophagitis with no bleeding was found.       Estimated blood loss: none.      Esophagogastric landmarks were identified: the gastroesophageal junction       was found at 40 cm from the incisors.      The Z-line was irregular. Biopsies were taken with a cold forceps for       histology. island of salmon colored mucosa ~1cm above z line Estimated       blood loss was minimal.      Normal mucosa was found in the entire esophagus. Biopsies were taken       with a cold forceps for histology. from mid esophagus Estimated blood       loss was minimal. The scope was withdrawn. Dilation was performed with a       Maloney dilator with no resistance at 52 Fr. The dilation site was       examined following endoscope reinsertion and showed no change. Estimated       blood loss: none.      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 Estimated blood loss: none.      The exam of the esophagus was otherwise normal. Impression:            - Normal ampulla, duodenal bulb, first portion of the                         duodenum and second portion of the duodenum.                        - Gastritis. Biopsied.                        - Small hiatal hernia.                        - LA Grade A reflux esophagitis with no bleeding.                        -  Esophagogastric landmarks identified.                        - Z-line irregular. Biopsied.                        - Normal mucosa was found in the entire esophagus.                         Biopsied. Dilated.                        - Ectopic gastric mucosa in the upper third of the                         esophagus. Recommendation:        - Discharge patient to home.                        - Soft diet today.                        - Advance diet  as tolerated.                        - Continue present medications.                        - No aspirin, ibuprofen, naproxen, or other                         non-steroidal anti-inflammatory drugs for 4 days after                         biopsy.                        - Use Protonix (pantoprazole) 40 mg PO BID for 8 weeks.                        - Await pathology results.                        - Return to GI clinic as previously scheduled.                        - Reschedule colonoscopy after bowel cleasne and new                         prep.                        - The findings and recommendations were discussed with                         the patient. Procedure Code(s):     --- Professional ---                        [id number], Esophagogastroduodenoscopy, flexible,                         transoral; with biopsy, single or multiple  43450, Dilation of esophagus, by unguided sound or                         bougie, single or multiple passes Diagnosis Code(s):     --- Professional ---                        K29.70, Gastritis, unspecified, without bleeding                        K44.9, Diaphragmatic hernia without obstruction or                         gangrene                        K21.00, Gastro-esophageal reflux disease with                         esophagitis, without bleeding                        K22.8, Other specified diseases of esophagus                        Q40.2, Other specified congenital malformations of                         stomach                        R13.10, Dysphagia, unspecified CPT copyright 2019 [employer]. All rights reserved. The codes documented in this report are preliminary and upon coder review may  be revised to meet current compliance requirements. Attending Participation:      I personally performed the entire procedure. Volney American, DO Annamaria Helling DO, DO 06/19/2022 8:41:08 AM This report has been  signed electronically. Number of Addenda: 0 Note Initiated On: 06/19/2022 8:11 AM Estimated Blood Loss:  Estimated blood loss was minimal.      [facility]

## 2022-06-19 NOTE — Anesthesia Preprocedure Evaluation (Signed)
Anesthesia Evaluation  Patient identified by MRN, date of birth, ID band Patient awake    Reviewed: Allergy & Precautions, NPO status , Patient's Chart, lab work & pertinent test results  History of Anesthesia Complications (+) PONV and history of anesthetic complications  Airway Mallampati: III   Neck ROM: Full    Dental  (+) Dental Advidsory Given   Pulmonary neg shortness of breath, sleep apnea and Continuous Positive Airway Pressure Ventilation , neg COPD, neg recent URI,    Pulmonary exam normal breath sounds clear to auscultation       Cardiovascular hypertension, (-) angina(-) CAD, (-) Past MI and (-) Cardiac Stents Normal cardiovascular exam(-) dysrhythmias + Valvular Problems/Murmurs MVP  Rhythm:Regular Rate:Normal  ECG 05/21/22: normal  Echo 03/07/21:  NORMAL LEFT VENTRICULAR SYSTOLIC FUNCTION  NORMAL RIGHT VENTRICULAR SYSTOLIC FUNCTION  MILD VALVULAR REGURGITATION  NO VALVULAR STENOSIS  TRIVIAL MR, PR  MILD TR  EF 55%  Closest EF: >55% (Estimated)  Mitral: TRIVIAL MR  Tricuspid: MILD TR   Myocardial perfusion 05/04/19:  1. Normal left ventricular systolic function with an EF of 65% 2. Normal myocardial thickening and wall motion 3. No artifact 4. Left ventricular cavity size normal 5. No evidence of stress-induced myocardial ischemia or arrhythmia; no scintigraphic evidence of scar 6. Normal low risk study   Neuro/Psych PSYCHIATRIC DISORDERS Anxiety negative neurological ROS     GI/Hepatic negative GI ROS, Neg liver ROS,   Endo/Other  neg diabetesHypothyroidism Obesity   Renal/GU Renal disease (nephrolithiasis)     Musculoskeletal   Abdominal   Peds  Hematology negative hematology ROS (+)   Anesthesia Other Findings Cardiology note 04/29/22:  71 y.o. female with  1. Bilateral carotid bruits  2. Atypical chest pain  3. Primary hypertension  4. Mitral valve prolapse syndrome  5. OSA on CPAP  6.  Pure hypercholesterolemia  7. Class 1 obesity due to excess calories without serious comorbidity with body mass index (BMI) of 32.0 to 32.9 in adult   Plan  1 obstructive sleep apnea by history recommend sleep study CPAP follow-up with pulmonary 2 atypical chest pain reasonably stable at this point recommend conservative management 3 primary hypertension with reasonable control continue conservative medical therapy with HCTZ 4 mitral valve prolapse by history would consider recommend echocardiogram for further assessment continue conservative management 5 hyperlipidemia continue diet and exercise consider statin therapy 6 mild obesity recommend modest weight loss exercise portion control 7 have the patient follow-up in 6 months  Return in about 6 months (around 10/30/2022).   Reproductive/Obstetrics Uterine CA                             Anesthesia Physical  Anesthesia Plan  ASA: 2  Anesthesia Plan: General   Post-op Pain Management:    Induction: Intravenous  PONV Risk Score and Plan: 4 or greater and Treatment may vary due to age or medical condition, Propofol infusion and TIVA  Airway Management Planned: Natural Airway and Nasal Cannula  Additional Equipment:   Intra-op Plan:   Post-operative Plan:   Informed Consent: I have reviewed the patients History and Physical, chart, labs and discussed the procedure including the risks, benefits and alternatives for the proposed anesthesia with the patient or authorized representative who has indicated his/her understanding and acceptance.     Dental advisory given  Plan Discussed with: CRNA  Anesthesia Plan Comments: (Patient consented for risks of anesthesia including but not limited to:  -  adverse reactions to medications - damage to eyes, teeth, lips or other oral mucosa - nerve damage due to positioning  - sore throat or hoarseness - damage to heart, brain, nerves, lungs, other parts of body  or loss of life  Informed patient about role of CRNA in peri- and intra-operative care.  Patient voiced understanding.)        Anesthesia Quick Evaluation

## 2022-06-19 NOTE — Interval H&P Note (Signed)
History and Physical Interval Note: Preprocedure H&P from 06/19/22  was reviewed and there was no interval change after seeing and examining the patient.  Written consent was obtained from the patient after discussion of risks, benefits, and alternatives. Patient has consented to proceed with Esophagogastroduodenoscopy with possible intervention   06/19/2022 8:01 AM  Denise Hunter  has presented today for surgery, with the diagnosis of K57.92  Diverticulitis K59.00  Constipation, unspecified constipation type Z86.010  - History of colon polyps R13.10 Dysphagia.  The various methods of treatment have been discussed with the patient and family. After consideration of risks, benefits and other options for treatment, the patient has consented to  Procedure(s): COLONOSCOPY WITH PROPOFOL (N/A) ESOPHAGOGASTRODUODENOSCOPY (EGD) (N/A) as a surgical intervention.  The patient's history has been reviewed, patient examined, no change in status, stable for surgery.  I have reviewed the patient's chart and labs.  Questions were answered to the patient's satisfaction.     Annamaria Helling

## 2022-06-19 NOTE — Transfer of Care (Signed)
Immediate Anesthesia Transfer of Care Note  Patient: Denise Hunter  Procedure(s) Performed: COLONOSCOPY WITH PROPOFOL ESOPHAGOGASTRODUODENOSCOPY (EGD)  Patient Location: PACU and Endoscopy Unit  Anesthesia Type:General  Level of Consciousness: awake and patient cooperative  Airway & Oxygen Therapy: Patient Spontanous Breathing  Post-op Assessment: Report given to RN and Post -op Vital signs reviewed and stable  Post vital signs: Reviewed and stable  Last Vitals:  Vitals Value Taken Time  BP 121/72 06/19/22 0838  Temp    Pulse 68 06/19/22 0840  Resp 24 06/19/22 0840  SpO2 96 % 06/19/22 0840  Vitals shown include unvalidated device data.  Last Pain:  Vitals:   06/19/22 0838  TempSrc: Temporal  PainSc: Asleep         Complications: No notable events documented.

## 2022-06-19 NOTE — Anesthesia Procedure Notes (Signed)
Procedure Name: MAC Date/Time: 06/19/2022 8:15 AM  Performed by: Jerrye Noble, CRNAPre-anesthesia Checklist: Patient identified, Emergency Drugs available, Suction available and Patient being monitored Patient Re-evaluated:Patient Re-evaluated prior to induction Oxygen Delivery Method: Nasal cannula

## 2022-06-20 ENCOUNTER — Encounter: Payer: Self-pay | Admitting: Gastroenterology

## 2022-06-20 LAB — SURGICAL PATHOLOGY

## 2022-06-22 NOTE — Anesthesia Postprocedure Evaluation (Signed)
Anesthesia Post Note  Patient: Denise Hunter  Procedure(s) Performed: ESOPHAGOGASTRODUODENOSCOPY (EGD)  Patient location during evaluation: Endoscopy Anesthesia Type: General Level of consciousness: awake and alert Pain management: pain level controlled Vital Signs Assessment: post-procedure vital signs reviewed and stable Respiratory status: spontaneous breathing, nonlabored ventilation, respiratory function stable and patient connected to nasal cannula oxygen Cardiovascular status: blood pressure returned to baseline and stable Postop Assessment: no apparent nausea or vomiting Anesthetic complications: no   No notable events documented.   Last Vitals:  Vitals:   06/19/22 0848 06/19/22 0858  BP: 132/78 140/79  Pulse: 61 (!) 57  Resp: (!) 22 20  Temp:    SpO2: 97% 100%    Last Pain:  Vitals:   06/19/22 0848  TempSrc:   PainSc: 0-No pain                 Martha Clan

## 2022-06-27 ENCOUNTER — Encounter: Payer: Self-pay | Admitting: Gastroenterology

## 2022-06-29 ENCOUNTER — Encounter: Payer: Self-pay | Admitting: Gastroenterology

## 2022-06-30 ENCOUNTER — Ambulatory Visit: Admission: RE | Admit: 2022-06-30 | Payer: Medicare Other | Source: Ambulatory Visit | Admitting: Gastroenterology

## 2022-06-30 ENCOUNTER — Other Ambulatory Visit: Payer: Self-pay | Admitting: Gastroenterology

## 2022-06-30 ENCOUNTER — Encounter: Admission: RE | Payer: Self-pay | Source: Ambulatory Visit

## 2022-06-30 DIAGNOSIS — K5909 Other constipation: Secondary | ICD-10-CM

## 2022-06-30 DIAGNOSIS — K56699 Other intestinal obstruction unspecified as to partial versus complete obstruction: Secondary | ICD-10-CM

## 2022-06-30 SURGERY — COLONOSCOPY
Anesthesia: General

## 2022-07-03 HISTORY — DX: Unspecified hemorrhoids: K64.9

## 2022-07-03 HISTORY — DX: Anxiety disorder, unspecified: F41.9

## 2022-07-03 HISTORY — DX: Irritable bowel syndrome without diarrhea: K58.9

## 2022-07-03 HISTORY — DX: Other chest pain: R07.89

## 2022-07-03 HISTORY — DX: Malignant neoplasm of uterus, part unspecified: C55

## 2022-07-03 HISTORY — DX: Other specified symptoms and signs involving the circulatory and respiratory systems: R09.89

## 2022-07-03 HISTORY — DX: Endocarditis, valve unspecified: I38

## 2022-07-03 HISTORY — DX: Calculus of kidney: N20.0

## 2022-07-03 HISTORY — DX: Diverticulosis of intestine, part unspecified, without perforation or abscess without bleeding: K57.90

## 2022-07-03 HISTORY — DX: Hyperlipidemia, unspecified: E78.5

## 2022-07-03 HISTORY — DX: Benign neoplasm, unspecified site: D36.9

## 2022-07-03 HISTORY — DX: Anemia, unspecified: D64.9

## 2022-07-09 ENCOUNTER — Ambulatory Visit
Admission: RE | Admit: 2022-07-09 | Discharge: 2022-07-09 | Disposition: A | Discharge status: Home / Self Care | Payer: Medicare Other | Source: Ambulatory Visit | Attending: Gastroenterology | Admitting: Gastroenterology

## 2022-07-09 DIAGNOSIS — K56699 Other intestinal obstruction unspecified as to partial versus complete obstruction: Secondary | ICD-10-CM | POA: Insufficient documentation

## 2022-07-09 DIAGNOSIS — K5909 Other constipation: Secondary | ICD-10-CM | POA: Insufficient documentation

## 2022-07-09 LAB — POCT I-STAT CREATININE: Creatinine, Ser: 0.9 mg/dL (ref 0.44–1.00)

## 2022-07-09 MED ORDER — IOHEXOL 300 MG/ML  SOLN
100.0000 mL | Freq: Once | INTRAMUSCULAR | Status: AC | PRN
  Administered 2022-07-09: 100 mL via INTRAVENOUS

## 2022-08-22 ENCOUNTER — Other Ambulatory Visit: Payer: Self-pay | Admitting: Internal Medicine

## 2022-08-22 DIAGNOSIS — Z1231 Encounter for screening mammogram for malignant neoplasm of breast: Secondary | ICD-10-CM

## 2022-09-24 ENCOUNTER — Encounter: Payer: Self-pay | Admitting: Gastroenterology

## 2022-09-24 NOTE — H&P (Signed)
Pre-Procedure H&P   Patient ID: Denise Hunter is a 71 y.o. female.  Gastroenterology Provider: Annamaria Helling, DO  Referring Provider: Laurine Blazer, PA PCP: Tracie Harrier, MD  Date: 09/25/2022  HPI [name] is a 71 y.o. female who presents today for Colonoscopy for Diverticulitis follow-up.  Pt had episode of diverticulitis - most recently in June treated with abx. Accompanied by llq pain. She has been noted to have significant constipation with stool burden on CT imaging. Notes her sx are worse with stress. Bowels did not move much with prep or miralax cleanout done a few days prior  S/p appy, thyroidectomy, hysterectomy  Last csy 06/2012- [facility]- ih, no polyps. Attempted to have colonoscopy this past July but she drank prep w/o many BM.  No melena or hematochezia. No weight loss/change in appetite.   Past Medical History:  Diagnosis Date   Adenomatous polyps    Anemia    Anxiety    a.) on BZO (alprazolam) PRN   Atypical chest pain    Bilateral carotid bruits    Chronic kidney disease    Diastolic dysfunction 37/16/9678   a.) TTE 02/20/2018: EF > 55%; mild LAE; G1DD.   Diverticulosis    Hemorrhoids    History of COVID-19 12/19/2020   HLD (hyperlipidemia)    Hypothyroidism    IBS (irritable bowel syndrome)    Lymphocytosis    Mitral valve prolapse syndrome    Nephrolithiasis    OSA on CPAP    Osteoporosis    PONV (postoperative nausea and vomiting)    Primary hypertension    Uterine cancer (HCC)    Valvular heart disease    a.) TTE 07/06/2015: EF >55%; mild LVH; mild LAE, mild MR/TR/PR. b.) TTE 08/13/2016: EF >55%; triv MR, mild TR. c.) TTE 02/20/2018: EF 55%; mild LAE, triv PR, mild MR, mod TR. d.) TTE 05/04/2019: EF >55%; LVH; mild MR/TR/PR. e.) TTE 03/07/2021: EF >55%, triv MR/PR, mild TR.    Past Surgical History:  Procedure Laterality Date   ABDOMINAL HYSTERECTOMY     BREAST CYST ASPIRATION     years ago    ESOPHAGOGASTRODUODENOSCOPY N/A 06/19/2022   Procedure: ESOPHAGOGASTRODUODENOSCOPY (EGD);  Surgeon: Annamaria Helling, DO;  Location: [facility] ENDOSCOPY;  Service: Gastroenterology;  Laterality: N/A;   JOINT REPLACEMENT     KNEE ARTHROSCOPY Left 05/28/2022   Procedure: ARTHROSCOPY KNEE WITH LYSIS OF ADHESIONS;  Surgeon: Dereck Leep, MD;  Location: [facility] ORS;  Service: Orthopedics;  Laterality: Left;   KNEE SURGERY     THYROID SURGERY      Family History No h/o GI disease or malignancy  Review of Systems  Constitutional:  Negative for activity change, appetite change, chills, diaphoresis, fatigue, fever and unexpected weight change.  HENT:  Negative for trouble swallowing and voice change.   Respiratory:  Negative for shortness of breath and wheezing.   Cardiovascular:  Negative for chest pain, palpitations and leg swelling.  Gastrointestinal:  Positive for abdominal pain and constipation. Negative for abdominal distention, anal bleeding, blood in stool, diarrhea, nausea, rectal pain and vomiting.  Musculoskeletal:  Negative for arthralgias and myalgias.  Skin:  Negative for color change and pallor.  Neurological:  Negative for dizziness, syncope and weakness.  Psychiatric/Behavioral:  Negative for confusion.   All other systems reviewed and are negative.    Medications No current facility-administered medications on file prior to encounter.   Current Outpatient Medications on File Prior to Encounter  Medication Sig Dispense Refill  levothyroxine (SYNTHROID) 88 MCG tablet Take 88 mcg by mouth daily. Take on an empty stomach with a glass of water at least 30-60 minutes before breakfast     telmisartan (MICARDIS) 40 MG tablet Take 1 tablet by mouth daily.     ALPRAZolam (XANAX) 0.5 MG tablet Take 0.5 mg by mouth 3 (three) times daily as needed for anxiety.     Ascorbic Acid (VITAMIN C) 1000 MG tablet Take 1,000 mg by mouth daily.     Calcium Carbonate-Vitamin D 600-200 MG-UNIT TABS  Take 1 tablet by mouth in the morning and at bedtime.     Calcium-Magnesium-Vitamin D [phone] LIQD Take by mouth.     docusate sodium (COLACE) 100 MG capsule Take 100 mg by mouth 2 (two) times daily.     doxycycline (VIBRAMYCIN) 100 MG capsule Take 100 mg by mouth 2 (two) times daily.     ezetimibe (ZETIA) 10 MG tablet Take 10 mg by mouth daily.     hydrochlorothiazide (HYDRODIURIL) 25 MG tablet      HYDROcodone-acetaminophen (NORCO) 5-325 MG tablet Take 1-2 tablets by mouth every 6 (six) hours as needed for moderate pain. 15 tablet 0   Misc Natural Products (APPLE CIDER VINEGAR DIET PO) Take by mouth.     Multiple Vitamins-Minerals (ALIVE MULTI-VITAMIN PO) Take by mouth.     nitroGLYCERIN (NITROSTAT) 0.4 MG SL tablet Place 0.4 mg under the tongue as needed.     triamcinolone (NASACORT) 55 MCG/ACT AERO nasal inhaler Place 2 sprays into the nose daily.     Zinc 50 MG CAPS Take by mouth.      Pertinent medications related to GI and procedure were reviewed by me with the patient prior to the procedure   Current Facility-Administered Medications:    0.9 %  sodium chloride infusion, , Intravenous, Continuous, Annamaria Helling, DO, Last Rate: 20 mL/hr at 09/25/22 0805, New Bag at 09/25/22 0805      Allergies  Allergen Reactions   Amitriptyline Palpitations    Chest pain- sent to Acute Care with angina   Bisphosphonates Diarrhea   Celecoxib Other (See Comments)   Morphine Nausea Only   Nsaids     arrythmias   Tramadol Hypertension   Allergies were reviewed by me prior to the procedure  Objective   Body mass index is 32.12 kg/m. Vitals:   09/25/22 0748  BP: (!) 152/90  Pulse: 86  Resp: 18  Temp: (!) 96.8 F (36 C)  TempSrc: Temporal  SpO2: 98%  Weight: 77.1 kg  Height: '5\' 1"'$  (1.549 m)     Physical Exam Vitals and nursing note reviewed.  Constitutional:      General: She is not in acute distress.    Appearance: Normal appearance. She is obese. She is not  ill-appearing, toxic-appearing or diaphoretic.  HENT:     Head: Normocephalic and atraumatic.     Nose: Nose normal.     Mouth/Throat:     Mouth: Mucous membranes are moist.     Pharynx: Oropharynx is clear.  Eyes:     General: No scleral icterus.    Extraocular Movements: Extraocular movements intact.  Cardiovascular:     Rate and Rhythm: Normal rate and regular rhythm.     Heart sounds: Normal heart sounds. No murmur heard.    No friction rub. No gallop.  Pulmonary:     Effort: Pulmonary effort is normal. No respiratory distress.     Breath sounds: Normal breath sounds. No wheezing, rhonchi or  rales.  Abdominal:     General: Bowel sounds are normal. There is no distension.     Palpations: Abdomen is soft.     Tenderness: There is no abdominal tenderness. There is no guarding or rebound.  Musculoskeletal:     Cervical back: Neck supple.     Right lower leg: No edema.     Left lower leg: No edema.  Skin:    General: Skin is warm and dry.     Coloration: Skin is not jaundiced or pale.  Neurological:     General: No focal deficit present.     Mental Status: She is alert and oriented to person, place, and time. Mental status is at baseline.  Psychiatric:        Mood and Affect: Mood normal.        Behavior: Behavior normal.        Thought Content: Thought content normal.        Judgment: Judgment normal.      Assessment:  [name] is a 71 y.o. female  who presents today for Colonoscopy for Diverticulitis follow-up.  Plan:  Colonoscopy with possible intervention today  Colonoscopy with possible biopsy, control of bleeding, polypectomy, and interventions as necessary has been discussed with the patient/patient representative. Informed consent was obtained from the patient/patient representative after explaining the indication, nature, and risks of the procedure including but not limited to death, bleeding, perforation, missed neoplasm/lesions, cardiorespiratory  compromise, and reaction to medications. Opportunity for questions was given and appropriate answers were provided. Patient/patient representative has verbalized understanding is amenable to undergoing the procedure.   Annamaria Helling, DO  [facility]  Portions of the record may have been created with voice recognition software. Occasional wrong-word or 'sound-a-like' substitutions may have occurred due to the inherent limitations of voice recognition software.  Read the chart carefully and recognize, using context, where substitutions may have occurred.

## 2022-09-25 ENCOUNTER — Ambulatory Visit
Admission: RE | Admit: 2022-09-25 | Discharge: 2022-09-25 | Disposition: A | Discharge status: Home / Self Care | Payer: Medicare Other | Source: Ambulatory Visit | Attending: Gastroenterology | Admitting: Gastroenterology

## 2022-09-25 ENCOUNTER — Encounter: Payer: Self-pay | Admitting: Gastroenterology

## 2022-09-25 ENCOUNTER — Ambulatory Visit: Payer: Medicare Other | Admitting: Anesthesiology

## 2022-09-25 ENCOUNTER — Encounter
Admission: RE | Disposition: A | Discharge status: Home / Self Care | Payer: Self-pay | Source: Ambulatory Visit | Attending: Gastroenterology

## 2022-09-25 DIAGNOSIS — D123 Benign neoplasm of transverse colon: Secondary | ICD-10-CM | POA: Insufficient documentation

## 2022-09-25 DIAGNOSIS — K5900 Constipation, unspecified: Secondary | ICD-10-CM | POA: Diagnosis not present

## 2022-09-25 DIAGNOSIS — K640 First degree hemorrhoids: Secondary | ICD-10-CM | POA: Diagnosis not present

## 2022-09-25 DIAGNOSIS — Z9049 Acquired absence of other specified parts of digestive tract: Secondary | ICD-10-CM | POA: Diagnosis not present

## 2022-09-25 DIAGNOSIS — E890 Postprocedural hypothyroidism: Secondary | ICD-10-CM | POA: Diagnosis not present

## 2022-09-25 DIAGNOSIS — I10 Essential (primary) hypertension: Secondary | ICD-10-CM | POA: Insufficient documentation

## 2022-09-25 DIAGNOSIS — E669 Obesity, unspecified: Secondary | ICD-10-CM | POA: Diagnosis not present

## 2022-09-25 DIAGNOSIS — Z6832 Body mass index (BMI) 32.0-32.9, adult: Secondary | ICD-10-CM | POA: Insufficient documentation

## 2022-09-25 DIAGNOSIS — K56699 Other intestinal obstruction unspecified as to partial versus complete obstruction: Secondary | ICD-10-CM | POA: Diagnosis not present

## 2022-09-25 DIAGNOSIS — G4733 Obstructive sleep apnea (adult) (pediatric): Secondary | ICD-10-CM | POA: Insufficient documentation

## 2022-09-25 DIAGNOSIS — Q438 Other specified congenital malformations of intestine: Secondary | ICD-10-CM | POA: Insufficient documentation

## 2022-09-25 DIAGNOSIS — Z09 Encounter for follow-up examination after completed treatment for conditions other than malignant neoplasm: Secondary | ICD-10-CM | POA: Insufficient documentation

## 2022-09-25 DIAGNOSIS — Z90710 Acquired absence of both cervix and uterus: Secondary | ICD-10-CM | POA: Insufficient documentation

## 2022-09-25 DIAGNOSIS — F419 Anxiety disorder, unspecified: Secondary | ICD-10-CM | POA: Diagnosis not present

## 2022-09-25 DIAGNOSIS — K5730 Diverticulosis of large intestine without perforation or abscess without bleeding: Secondary | ICD-10-CM | POA: Diagnosis not present

## 2022-09-25 HISTORY — PX: COLONOSCOPY WITH PROPOFOL: SHX5780

## 2022-09-25 SURGERY — COLONOSCOPY WITH PROPOFOL
Anesthesia: General

## 2022-09-25 MED ORDER — PROPOFOL 10 MG/ML IV BOLUS
INTRAVENOUS | Status: DC | PRN
  Administered 2022-09-25: 20 mg via INTRAVENOUS
  Administered 2022-09-25: 40 mg via INTRAVENOUS

## 2022-09-25 MED ORDER — SODIUM CHLORIDE 0.9 % IV SOLN: INTRAVENOUS | Status: DC

## 2022-09-25 MED ORDER — LIDOCAINE HCL (CARDIAC) PF 100 MG/5ML IV SOSY
PREFILLED_SYRINGE | INTRAVENOUS | Status: DC | PRN
  Administered 2022-09-25: 40 mg via INTRAVENOUS

## 2022-09-25 MED ORDER — PROPOFOL 500 MG/50ML IV EMUL
INTRAVENOUS | Status: DC | PRN
  Administered 2022-09-25: 165 ug/kg/min via INTRAVENOUS

## 2022-09-25 MED ORDER — GLYCOPYRROLATE 0.2 MG/ML IJ SOLN
INTRAMUSCULAR | Status: DC | PRN
  Administered 2022-09-25: .2 mg via INTRAVENOUS

## 2022-09-25 NOTE — Interval H&P Note (Signed)
History and Physical Interval Note: Preprocedure H&P from 09/25/22  was reviewed and there was no interval change after seeing and examining the patient.  Written consent was obtained from the patient after discussion of risks, benefits, and alternatives. Patient has consented to proceed with Colonoscopy with possible intervention   09/25/2022 8:36 AM  Denise Hunter  has presented today for surgery, with the diagnosis of K57.92  - Diverticulitis K59.00  - Constipation, unspecified constipation type Z86.010  - History of colon polyps.  The various methods of treatment have been discussed with the patient and family. After consideration of risks, benefits and other options for treatment, the patient has consented to  Procedure(s): COLONOSCOPY WITH PROPOFOL (N/A) as a surgical intervention.  The patient's history has been reviewed, patient examined, no change in status, stable for surgery.  I have reviewed the patient's chart and labs.  Questions were answered to the patient's satisfaction.     Annamaria Helling

## 2022-09-25 NOTE — Op Note (Addendum)
[facility]enterology Patient Name: Denise Hunter Procedure Date: 09/25/2022 8:28 AM MRN: [id number] Account #: [id number] Date of Birth: Mar 17, 1951 Admit Type: Outpatient Age: 71 Room: [facility] ENDO ROOM 1 Gender: Female Note Status: Supervisor Override Instrument Name: Peds Colonoscope [id number] Procedure:             Colonoscopy Indications:           Personal history of colonic polyps, Constipation,                         Diverticulitis Providers:             Annamaria Helling DO, DO Referring MD:          Tracie Harrier, MD (Referring MD) Medicines:             Monitored Anesthesia Care Complications:         No immediate complications. Estimated blood loss:                         Minimal. Procedure:             Pre-Anesthesia Assessment:                        - Prior to the procedure, a History and Physical was                         performed, and patient medications and allergies were                         reviewed. The patient is competent. The risks and                         benefits of the procedure and the sedation options and                         risks were discussed with the patient. All questions                         were answered and informed consent was obtained.                         Patient identification and proposed procedure were                         verified by the physician, the nurse, the anesthetist                         and the technician in the endoscopy suite. Mental                         Status Examination: alert and oriented. Airway                         Examination: normal oropharyngeal airway and neck                         mobility. Respiratory Examination: clear to  auscultation. CV Examination: RRR, no murmurs, no S3                         or S4. Prophylactic Antibiotics: The patient does not                         require prophylactic antibiotics. Prior                          Anticoagulants: The patient has taken no previous                         anticoagulant or antiplatelet agents. ASA Grade                         Assessment: II - A patient with mild systemic disease.                         After reviewing the risks and benefits, the patient                         was deemed in satisfactory condition to undergo the                         procedure. The anesthesia plan was to use monitored                         anesthesia care (MAC). Immediately prior to                         administration of medications, the patient was                         re-assessed for adequacy to receive sedatives. The                         heart rate, respiratory rate, oxygen saturations,                         blood pressure, adequacy of pulmonary ventilation, and                         response to care were monitored throughout the                         procedure. The physical status of the patient was                         re-assessed after the procedure.                        After obtaining informed consent, the colonoscope was                         passed under direct vision. Throughout the procedure,                         the patient's blood pressure, pulse, and oxygen  saturations were monitored continuously. The                         Colonoscope was introduced through the anus and                         advanced to the the cecum, identified by appendiceal                         orifice and ileocecal valve. The colonoscopy was                         technically difficult and complex due to multiple                         diverticula in the colon, restricted mobility of the                         colon and a tortuous colon. Successful completion of                         the procedure was aided by straightening and                         shortening the scope to obtain bowel loop reduction,                          using scope torsion, applying abdominal pressure and                         lavage. The patient tolerated the procedure well. The                         quality of the bowel preparation was evaluated using                         the BBPS [facility] Bowel Preparation Scale) with scores                         of: Right Colon = 3, Transverse Colon = 3 and Left                         Colon = 3 (entire mucosa seen well with no residual                         staining, small fragments of stool or opaque liquid).                         The total BBPS score equals 9. The ileocecal valve,                         appendiceal orifice, and rectum were photographed. Findings:      The perianal and digital rectal examinations were normal. Pertinent       negatives include normal sphincter tone.      A 3 to 4 mm polyp was found in the transverse colon. The polyp was  sessile. The polyp was removed with a cold snare. Resection and       retrieval were complete. Estimated blood loss was minimal.      A 1 to 2 mm polyp was found in the transverse colon. The polyp was       sessile. The polyp was removed with a jumbo cold forceps. Resection and       retrieval were complete. Estimated blood loss was minimal.      Multiple small-mouthed diverticula were found in the left colon. Severe       diverticulosis. Estimated blood loss: none.      A benign-appearing, intrinsic moderate stenosis measuring of unknown       length was found in the sigmoid colon and was traversed. Estimated blood       loss: none.      Non-bleeding internal hemorrhoids were found during retroflexion. The       hemorrhoids were Grade I (internal hemorrhoids that do not prolapse).      The exam was otherwise without abnormality on direct and retroflexion       views. Impression:            - One 3 to 4 mm polyp in the transverse colon, removed                         with a cold snare. Resected and retrieved.                         - One 1 to 2 mm polyp in the transverse colon, removed                         with a jumbo cold forceps. Resected and retrieved.                        - Diverticulosis in the left colon.                        - Stricture in the sigmoid colon.                        - Non-bleeding internal hemorrhoids.                        - The examination was otherwise normal on direct and                         retroflexion views. Recommendation:        - Patient has a contact number available for                         emergencies. The signs and symptoms of potential                         delayed complications were discussed with the patient.                         Return to normal activities tomorrow. Written                         discharge instructions were provided to the patient.                        -  Discharge patient to home.                        - Resume previous diet.                        - Continue present medications.                        - Await pathology results.                        - Repeat colonoscopy for surveillance based on                         pathology results.                        - Return to GI office as previously scheduled.                        - The findings and recommendations were discussed with                         the patient. Procedure Code(s):     --- Professional ---                        [id number], Colonoscopy, flexible; with removal of                         tumor(s), polyp(s), or other lesion(s) by snare                         technique                        45380, 34, Colonoscopy, flexible; with biopsy, single                         or multiple Diagnosis Code(s):     --- Professional ---                        Z12.11, Encounter for screening for malignant neoplasm                         of colon                        K63.5, Polyp of colon                        K64.0, First degree hemorrhoids                        K56.699,  Other intestinal obstruction unspecified as                         to partial versus complete obstruction                        K57.30, Diverticulosis of large intestine without  perforation or abscess without bleeding CPT copyright 2019 American Medical Association. All rights reserved. The codes documented in this report are preliminary and upon coder review may  be revised to meet current compliance requirements. Attending Participation:      I personally performed the entire procedure. Volney American, DO Annamaria Helling DO, DO 09/25/2022 9:32:54 AM This report has been signed electronically. Number of Addenda: 0 Note Initiated On: 09/25/2022 8:28 AM Scope Withdrawal Time: 0 hours 19 minutes 54 seconds  Total Procedure Duration: 0 hours 36 minutes 40 seconds  Estimated Blood Loss:  Estimated blood loss was minimal.      [facility]

## 2022-09-25 NOTE — Anesthesia Procedure Notes (Addendum)
Procedure Name: General with mask airway Date/Time: 09/25/2022 8:46 AM  Performed by: Kelton Pillar, CRNAPre-anesthesia Checklist: Patient identified, Emergency Drugs available, Suction available and Patient being monitored Patient Re-evaluated:Patient Re-evaluated prior to induction Oxygen Delivery Method: Simple face mask Induction Type: IV induction Placement Confirmation: positive ETCO2, CO2 detector and breath sounds checked- equal and bilateral Dental Injury: Teeth and Oropharynx as per pre-operative assessment

## 2022-09-25 NOTE — Anesthesia Preprocedure Evaluation (Addendum)
Anesthesia Evaluation  Patient identified by MRN, date of birth, ID band Patient awake    Reviewed: Allergy & Precautions, NPO status , Patient's Chart, lab work & pertinent test results  History of Anesthesia Complications (+) PONV and history of anesthetic complications  Airway Mallampati: II   Neck ROM: Full    Dental  (+) Dental Advidsory Given   Pulmonary neg shortness of breath, sleep apnea and Continuous Positive Airway Pressure Ventilation , neg COPD, neg recent URI,    Pulmonary exam normal breath sounds clear to auscultation       Cardiovascular Exercise Tolerance: Good hypertension, Pt. on medications (-) angina(-) CAD, (-) Past MI and (-) Cardiac Stents Normal cardiovascular exam(-) dysrhythmias + Valvular Problems/Murmurs MVP  Rhythm:Regular Rate:Normal  ECG 05/21/22: normal  Echo 03/07/21:  NORMAL LEFT VENTRICULAR SYSTOLIC FUNCTION  NORMAL RIGHT VENTRICULAR SYSTOLIC FUNCTION  MILD VALVULAR REGURGITATION  NO VALVULAR STENOSIS  TRIVIAL MR, PR  MILD TR  EF 55%  Closest EF: >55% (Estimated)  Mitral: TRIVIAL MR  Tricuspid: MILD TR   Myocardial perfusion 05/04/19:  1. Normal left ventricular systolic function with an EF of 65% 2. Normal myocardial thickening and wall motion 3. No artifact 4. Left ventricular cavity size normal 5. No evidence of stress-induced myocardial ischemia or arrhythmia; no scintigraphic evidence of scar 6. Normal low risk study   Neuro/Psych PSYCHIATRIC DISORDERS Anxiety negative neurological ROS     GI/Hepatic negative GI ROS, Neg liver ROS,   Endo/Other  neg diabetesHypothyroidism Obesity   Renal/GU Renal disease (nephrolithiasis)     Musculoskeletal   Abdominal (+) + obese,   Peds  Hematology negative hematology ROS (+)   Anesthesia Other Findings Cardiology note 04/29/22:  71 y.o. female with  1. Bilateral carotid bruits  2. Atypical chest pain  3. Primary hypertension   4. Mitral valve prolapse syndrome  5. OSA on CPAP  6. Pure hypercholesterolemia  7. Class 1 obesity due to excess calories without serious comorbidity with body mass index (BMI) of 32.0 to 32.9 in adult    Reproductive/Obstetrics Uterine CA                            Anesthesia Physical  Anesthesia Plan  ASA: 2  Anesthesia Plan: General   Post-op Pain Management:    Induction: Intravenous  PONV Risk Score and Plan: 4 or greater and Treatment may vary due to age or medical condition, Propofol infusion and TIVA  Airway Management Planned: Natural Airway and Nasal Cannula  Additional Equipment:   Intra-op Plan:   Post-operative Plan:   Informed Consent: I have reviewed the patients History and Physical, chart, labs and discussed the procedure including the risks, benefits and alternatives for the proposed anesthesia with the patient or authorized representative who has indicated his/her understanding and acceptance.     Dental advisory given  Plan Discussed with: CRNA  Anesthesia Plan Comments:        Anesthesia Quick Evaluation

## 2022-09-25 NOTE — Anesthesia Postprocedure Evaluation (Signed)
Anesthesia Post Note  Patient: Denise Hunter  Procedure(s) Performed: COLONOSCOPY WITH PROPOFOL  Patient location during evaluation: Endoscopy Anesthesia Type: General Level of consciousness: awake and alert Pain management: pain level controlled Vital Signs Assessment: post-procedure vital signs reviewed and stable Respiratory status: spontaneous breathing, nonlabored ventilation and respiratory function stable Cardiovascular status: blood pressure returned to baseline and stable Postop Assessment: no apparent nausea or vomiting Anesthetic complications: no   No notable events documented.   Last Vitals:  Vitals:   09/25/22 0930 09/25/22 0948  BP: 120/62 122/76  Pulse: 75 (!) 54  Resp: 17 16  Temp: (!) 35.8 C   SpO2: 100% 100%    Last Pain:  Vitals:   09/25/22 0948  TempSrc:   PainSc: 0-No pain                 [place] Ouch

## 2022-09-25 NOTE — Transfer of Care (Addendum)
Immediate Anesthesia Transfer of Care Note  Patient: Denise Hunter  Procedure(s) Performed: COLONOSCOPY WITH PROPOFOL  Patient Location: Endoscopy Unit  Anesthesia Type:General  Level of Consciousness: drowsy and patient cooperative  Airway & Oxygen Therapy: Patient Spontanous Breathing and Patient connected to face mask oxygen  Post-op Assessment: Report given to RN and Post -op Vital signs reviewed and stable  Post vital signs: Reviewed and stable  Last Vitals:  Vitals Value Taken Time  BP 120/62 09/25/22 0930  Temp 35.8 C 09/25/22 0930  Pulse 72 09/25/22 0932  Resp 18 09/25/22 0932  SpO2 100 % 09/25/22 0932  Vitals shown include unvalidated device data.  Last Pain:  Vitals:   09/25/22 0930  TempSrc: Tympanic  PainSc: 0-No pain         Complications: No notable events documented.

## 2022-09-26 ENCOUNTER — Encounter: Payer: Self-pay | Admitting: Gastroenterology

## 2022-09-26 LAB — SURGICAL PATHOLOGY

## 2023-01-16 ENCOUNTER — Ambulatory Visit
Admission: RE | Admit: 2023-01-16 | Discharge: 2023-01-16 | Disposition: A | Discharge status: Home / Self Care | Payer: Medicare Other | Source: Ambulatory Visit | Attending: Internal Medicine | Admitting: Internal Medicine

## 2023-01-16 DIAGNOSIS — Z1231 Encounter for screening mammogram for malignant neoplasm of breast: Secondary | ICD-10-CM | POA: Insufficient documentation

## 2023-04-01 ENCOUNTER — Other Ambulatory Visit: Payer: Medicare Other

## 2023-04-01 ENCOUNTER — Other Ambulatory Visit: Payer: Self-pay

## 2023-04-01 ENCOUNTER — Encounter: Payer: Self-pay | Admitting: Internal Medicine

## 2023-04-01 ENCOUNTER — Inpatient Hospital Stay: Payer: Medicare Other | Attending: Internal Medicine | Admitting: Internal Medicine

## 2023-04-01 ENCOUNTER — Ambulatory Visit: Payer: Medicare Other | Admitting: Internal Medicine

## 2023-04-01 ENCOUNTER — Inpatient Hospital Stay: Payer: Medicare Other

## 2023-04-01 VITALS — BP 124/77 | HR 70 | Temp 97.1°F | Resp 20 | Wt 174.2 lb

## 2023-04-01 DIAGNOSIS — D72820 Lymphocytosis (symptomatic): Secondary | ICD-10-CM | POA: Diagnosis not present

## 2023-04-01 LAB — COMPREHENSIVE METABOLIC PANEL
ALT: 24 U/L (ref 0–44)
AST: 27 U/L (ref 15–41)
Albumin: 4.2 g/dL (ref 3.5–5.0)
Alkaline Phosphatase: 57 U/L (ref 38–126)
Anion gap: 6 (ref 5–15)
BUN: 22 mg/dL (ref 8–23)
CO2: 24 mmol/L (ref 22–32)
Calcium: 9.4 mg/dL (ref 8.9–10.3)
Chloride: 107 mmol/L (ref 98–111)
Creatinine, Ser: 0.94 mg/dL (ref 0.44–1.00)
GFR, Estimated: >60 mL/min (ref >60)
Glucose, Bld: 104 mg/dL — ABNORMAL HIGH (ref 70–99)
Potassium: 4.3 mmol/L (ref 3.5–5.1)
Sodium: 137 mmol/L (ref 135–145)
Total Bilirubin: 0.7 mg/dL (ref 0.3–1.2)
Total Protein: 7.2 g/dL (ref 6.5–8.1)

## 2023-04-01 LAB — CBC WITH DIFFERENTIAL/PLATELET
Abs Immature Granulocytes: 0.01 10*3/uL (ref 0.00–0.07)
Basophils Absolute: 0.1 10*3/uL (ref 0.0–0.1)
Basophils Relative: 2 %
Eosinophils Absolute: 0.1 10*3/uL (ref 0.0–0.5)
Eosinophils Relative: 2 %
HCT: 42.6 % (ref 36.0–46.0)
Hemoglobin: 14 g/dL (ref 12.0–15.0)
Immature Granulocytes: 0 %
Lymphocytes Relative: 54 %
Lymphs Abs: 2.8 10*3/uL (ref 0.7–4.0)
MCH: 28.3 pg (ref 26.0–34.0)
MCHC: 32.9 g/dL (ref 30.0–36.0)
MCV: 86.2 fL (ref 80.0–100.0)
Monocytes Absolute: 0.4 10*3/uL (ref 0.1–1.0)
Monocytes Relative: 8 %
Neutro Abs: 1.7 10*3/uL (ref 1.7–7.7)
Neutrophils Relative %: 34 %
Platelets: 268 10*3/uL (ref 150–400)
RBC: 4.94 MIL/uL (ref 3.87–5.11)
RDW: 13.6 % (ref 11.5–15.5)
WBC: 5.1 10*3/uL (ref 4.0–10.5)
nRBC: 0 % (ref 0.0–0.2)

## 2023-04-01 LAB — LACTATE DEHYDROGENASE: LDH: 145 U/L (ref 98–192)

## 2023-04-01 NOTE — Progress Notes (Signed)
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   Oncology History   No history exists.   HISTORY OF PRESENTING ILLNESS: Alone.  Ambulating independently.  Denise Hunter 72 y.o.  female with no prior history of malignancy -mild lymphocytosis is here for follow-up.   She has  been taking care of her mother.  Denies any night sweats.  Denies any weight loss.   Review of Systems  Constitutional:  Positive for malaise/fatigue. Negative for chills, diaphoresis, fever and weight loss.  HENT:  Negative for nosebleeds and sore throat.   Eyes:  Negative for double vision.  Respiratory:  Negative for cough, hemoptysis, sputum production, shortness of breath and wheezing.   Cardiovascular:  Negative for chest pain, palpitations, orthopnea and leg swelling.  Gastrointestinal:  Negative for abdominal pain, blood in stool, constipation, diarrhea, heartburn, melena, nausea and vomiting.  Genitourinary:  Negative for dysuria, frequency and urgency.  Musculoskeletal:  Positive for joint pain. Negative for back pain.  Skin: Negative.  Negative for itching and rash.  Neurological:  Negative for dizziness, tingling, focal weakness, weakness and headaches.  Endo/Heme/Allergies:  Does not bruise/bleed easily.  Psychiatric/Behavioral:  Negative for depression. The patient is not nervous/anxious and does not have insomnia.      MEDICAL HISTORY:  Past Medical History:  Diagnosis Date   Adenomatous polyps    Anemia    Anxiety    a.) on BZO (alprazolam) PRN    Atypical chest pain    Bilateral carotid bruits    Chronic kidney disease    Diastolic dysfunction 02/20/2018   a.) TTE 02/20/2018: EF > 55%; mild LAE; G1DD.   Diverticulosis    Hemorrhoids    History of COVID-19 12/19/2020   HLD (hyperlipidemia)    Hypothyroidism    IBS (irritable bowel syndrome)    Lymphocytosis    Mitral valve prolapse syndrome    Nephrolithiasis    OSA on CPAP    Osteoporosis    PONV (postoperative nausea and vomiting)    Primary hypertension    Uterine cancer    Valvular heart disease    a.) TTE 07/06/2015: EF >55%; mild LVH; mild LAE, mild MR/TR/PR. b.) TTE 08/13/2016: EF >55%; triv MR, mild TR. c.) TTE 02/20/2018: EF 55%; mild LAE, triv PR, mild MR, mod TR. d.) TTE 05/04/2019: EF >55%; LVH; mild MR/TR/PR. e.) TTE 03/07/2021: EF >55%, triv MR/PR, mild TR.    SURGICAL HISTORY: Past Surgical History:  Procedure Laterality Date   ABDOMINAL HYSTERECTOMY     BREAST CYST ASPIRATION     years ago   COLONOSCOPY WITH PROPOFOL N/A 09/25/2022   Procedure: COLONOSCOPY WITH PROPOFOL;  Surgeon: Jaynie Collins, DO;  Location: [facility] ENDOSCOPY;  Service: Gastroenterology;  Laterality: N/A;   ESOPHAGOGASTRODUODENOSCOPY N/A 06/19/2022   Procedure: ESOPHAGOGASTRODUODENOSCOPY (EGD);  Surgeon: Jaynie Collins, DO;  Location: [facility] ENDOSCOPY;  Service: Gastroenterology;  Laterality: N/A;   JOINT REPLACEMENT     KNEE ARTHROSCOPY Left 05/28/2022   Procedure: ARTHROSCOPY KNEE WITH LYSIS OF ADHESIONS;  Surgeon: Donato Heinz, MD;  Location: [facility] ORS;  Service: Orthopedics;  Laterality: Left;   KNEE SURGERY     THYROID SURGERY      SOCIAL HISTORY: Social History   Socioeconomic History   Marital status: Married    Spouse name: Not on file   Number of children: Not on file   Years of education: Not on file   Highest education level: Not on file  Occupational History   Not on file  Tobacco Use   Smoking status: Never   Smokeless tobacco: Never  Vaping Use    Vaping Use: Never used  Substance and Sexual Activity   Alcohol use: No   Drug use: No   Sexual activity: Never  Other Topics Concern   Not on file  Social History Narrative   Retired Engineer, civil (consulting).  No smoking, alcohol abuse.  She had been taking care of her parents.  Parents recently moved to assisted living   Social Determinants of Health   Financial Resource Strain: Not on file  Food Insecurity: Not on file  Transportation Needs: Not on file  Physical Activity: Not on file  Stress: Not on file  Social Connections: Not on file  Intimate Partner Violence: Not on file    FAMILY HISTORY: Family History  Problem Relation Age of Onset   Skin cancer Mother    Cancer Maternal Grandfather    Breast cancer Neg Hx     ALLERGIES:  is allergic to amitriptyline, bisphosphonates, celecoxib, morphine, nsaids, and tramadol.  MEDICATIONS:  Current Outpatient Medications  Medication Sig Dispense Refill   ALPRAZolam (XANAX) 0.5 MG tablet Take 0.5 mg by mouth 3 (three) times daily as needed for anxiety.     Ascorbic Acid (VITAMIN C) 1000 MG tablet Take 1,000 mg by mouth daily.     Calcium Carbonate-Vitamin D 600-200 MG-UNIT TABS Take 1 tablet by mouth in the morning and at bedtime.     docusate sodium (COLACE) 100 MG capsule Take 100 mg by mouth 2 (two) times daily.     levothyroxine (SYNTHROID) 88 MCG tablet Take 88 mcg by mouth daily. Take on an empty stomach with a glass of water at least 30-60 minutes before breakfast     Misc Natural Products (APPLE CIDER VINEGAR DIET PO) Take by mouth.     Multiple Vitamins-Minerals (ALIVE MULTI-VITAMIN PO) Take by mouth.     telmisartan (MICARDIS) 20 MG tablet Take 20 mg by mouth daily.     triamcinolone (NASACORT) 55 MCG/ACT AERO nasal inhaler Place 2 sprays into the nose daily.     Zinc 50 MG CAPS Take by mouth.     hydrochlorothiazide (HYDRODIURIL) 25 MG tablet  (Patient not taking: Reported on 04/01/2023)     nitroGLYCERIN (NITROSTAT) 0.4 MG SL tablet  Place 0.4 mg under the tongue as needed. (Patient not taking: Reported on 04/01/2023)     No current facility-administered medications for this visit.      Marland Kitchen  PHYSICAL EXAMINATION: ECOG PERFORMANCE STATUS: 0 - Asymptomatic  Vitals:   04/01/23 0930  BP: 124/77  Pulse: 70  Resp: 20  Temp: (!) 97.1 F (36.2 C)    Filed Weights   04/01/23 0936  Weight: 174 lb 3.2 oz (79 kg)     Physical Exam Constitutional:      Comments: Patient is walk independently.  She is alone.  HENT:     Head: Normocephalic and atraumatic.     Mouth/Throat:     Pharynx: No oropharyngeal exudate.  Eyes:     Pupils: Pupils are equal, round, and  reactive to light.  Cardiovascular:     Rate and Rhythm: Normal rate and regular rhythm.  Pulmonary:     Effort: Pulmonary effort is normal. No respiratory distress.     Breath sounds: Normal breath sounds. No wheezing.  Abdominal:     General: Bowel sounds are normal. There is no distension.     Palpations: Abdomen is soft. There is no mass.     Tenderness: There is no abdominal tenderness. There is no guarding or rebound.  Musculoskeletal:        General: No tenderness. Normal range of motion.     Cervical back: Normal range of motion and neck supple.  Skin:    General: Skin is warm.  Neurological:     Mental Status: She is alert and oriented to person, place, and time.  Psychiatric:        Mood and Affect: Affect normal.      LABORATORY DATA:  I have reviewed the data as listed Lab Results  Component Value Date   WBC 5.1 04/01/2023   HGB 14.0 04/01/2023   HCT 42.6 04/01/2023   MCV 86.2 04/01/2023   PLT 268 04/01/2023   Recent Labs    04/01/22 1257 07/09/22 1125 04/01/23 0920  NA 139  --  137  K 4.2  --  4.3  CL 104  --  107  CO2 28  --  24  GLUCOSE 117*  --  104*  BUN 32*  --  22  CREATININE 0.88 0.90 0.94  CALCIUM 10.0  --  9.4  GFRNONAA >60  --  >60  PROT  --   --  7.2  ALBUMIN  --   --  4.2  AST  --   --  27  ALT  --    --  24  ALKPHOS  --   --  57  BILITOT  --   --  0.7    RADIOGRAPHIC STUDIES: I have personally reviewed the radiological images as listed and agreed with the findings in the report. No results found.  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  Normal hemoglobin/platelets.  Monitor for now. Stable.   #Fatigue-question caregiver; has CPAP- not using; I doubt if patient's fatigue is related to patient's mild lymphocytosis. Stable.   # chronic constipation- colonoscopy [2023- Oct; Dr.Russo]- stable.   #Since patient is clinically stable I think is reasonable for the patient to follow-up with PCP/can follow-up with [place] as needed.  Patient comfortable with the plan; to call us if any questions or concerns in the interim.  # DISPOSITION:  # follow up  as needed- Dr.B   All questions were answered. The patient knows to call the clinic with any problems, questions or concerns.    Earna Coder, MD 04/01/2023 10:12 AM

## 2023-04-01 NOTE — Assessment & Plan Note (Addendum)
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  Normal hemoglobin/platelets.  Monitor for now. Stable.   #Fatigue-question caregiver; has CPAP- not using; I doubt if patient's fatigue is related to patient's mild lymphocytosis. Stable.   # chronic constipation- colonoscopy [2023- Oct; Dr.Russo]- stable.   #Since patient is clinically stable I think is reasonable for the patient to follow-up with PCP/can follow-up with [place] as needed.  Patient comfortable with the plan; to call us if any questions or concerns in the interim.  # DISPOSITION:  # follow up  as needed- Dr.B

## 2023-04-14 IMAGING — MG MM DIGITAL SCREENING BILAT W/ TOMO AND CAD
8 series · 8 of 24 positions shown · non-contrast
Comparison: Previous exam(s).

ACR Breast Density Category a: The breast tissue is almost entirely
fatty.

CLINICAL DATA: Screening.

EXAM:
DIGITAL SCREENING BILATERAL MAMMOGRAM WITH TOMOSYNTHESIS AND CAD
TECHNIQUE: Bilateral screening digital craniocaudal and mediolateral oblique
mammograms were obtained. Bilateral screening digital breast
tomosynthesis was performed. The images were evaluated with
computer-aided detection.

[L CC synth-2D]
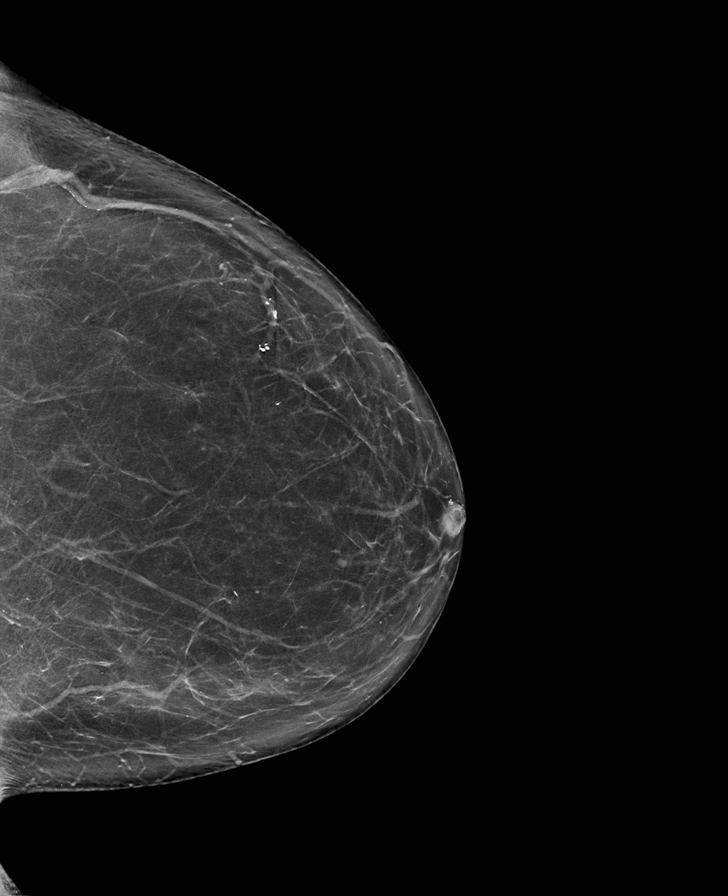

[L MLO synth-2D]
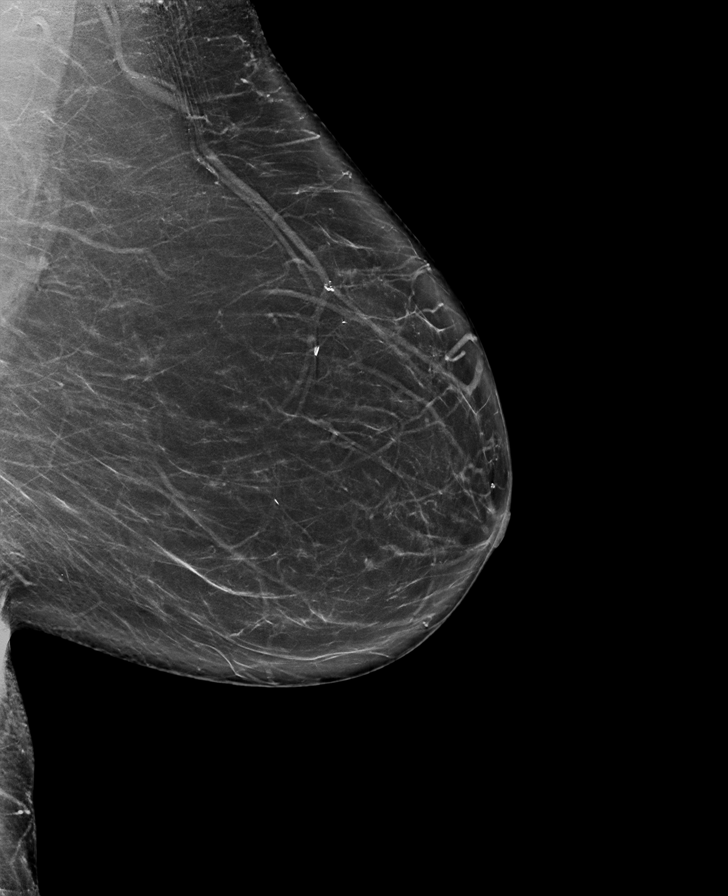

[R MLO synth-2D]
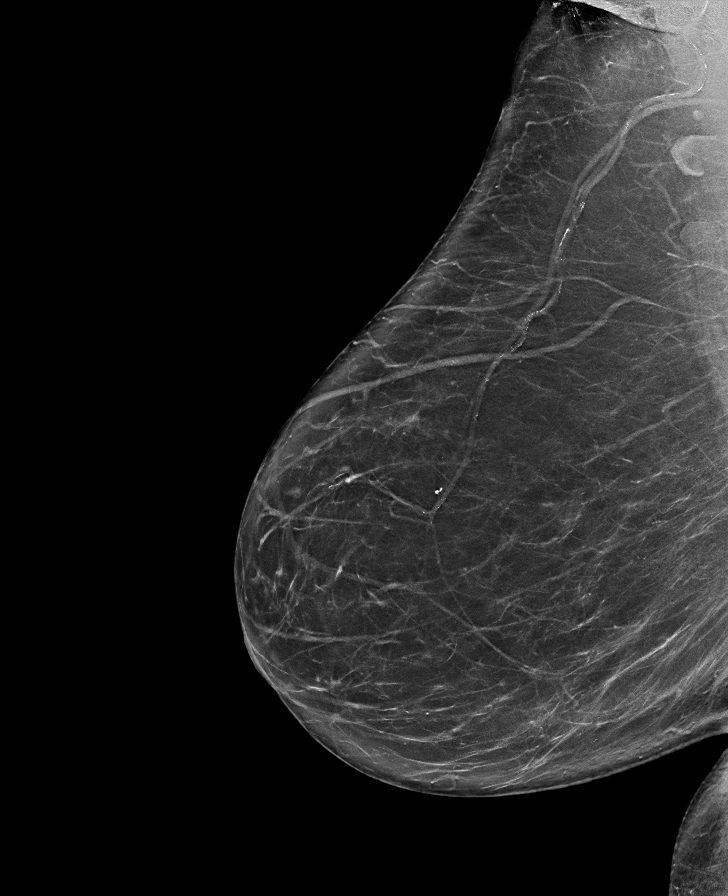

[R CC synth-2D]
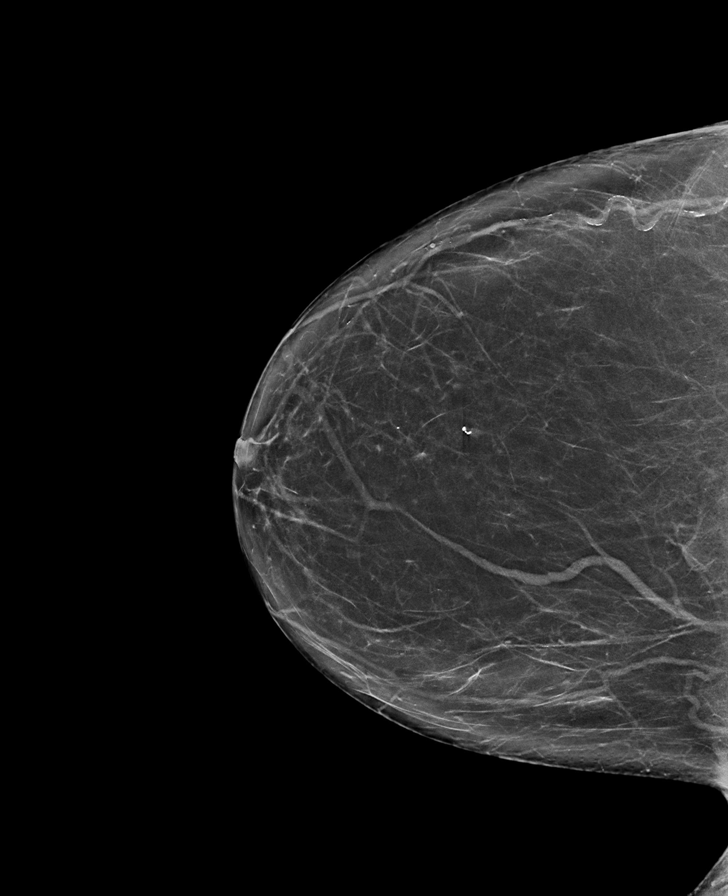

[R MLO tomo · tomo slice 41/80.0]
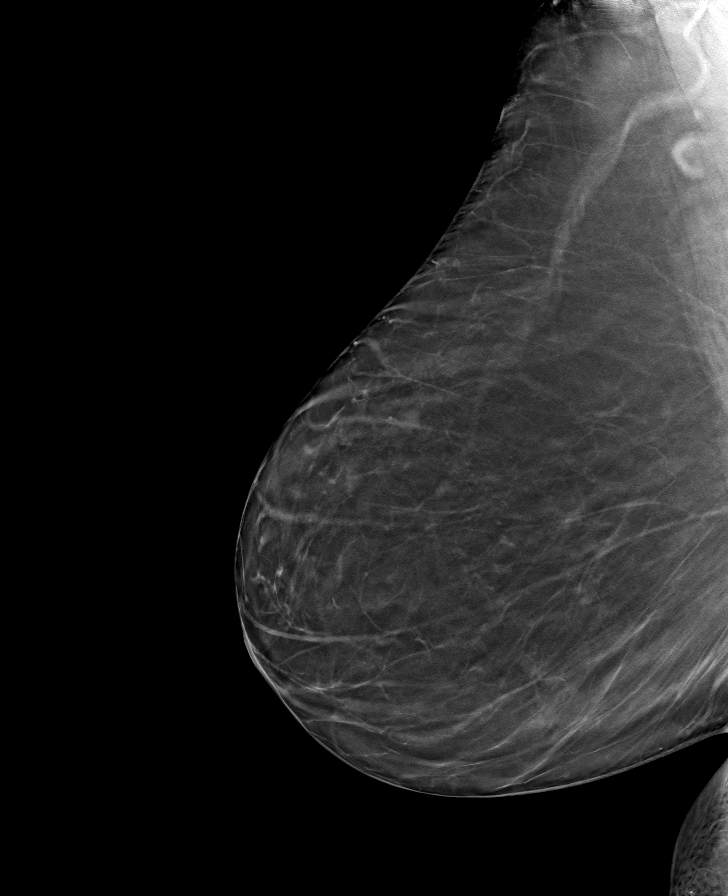

[R CC tomo · tomo slice 39/77.0]
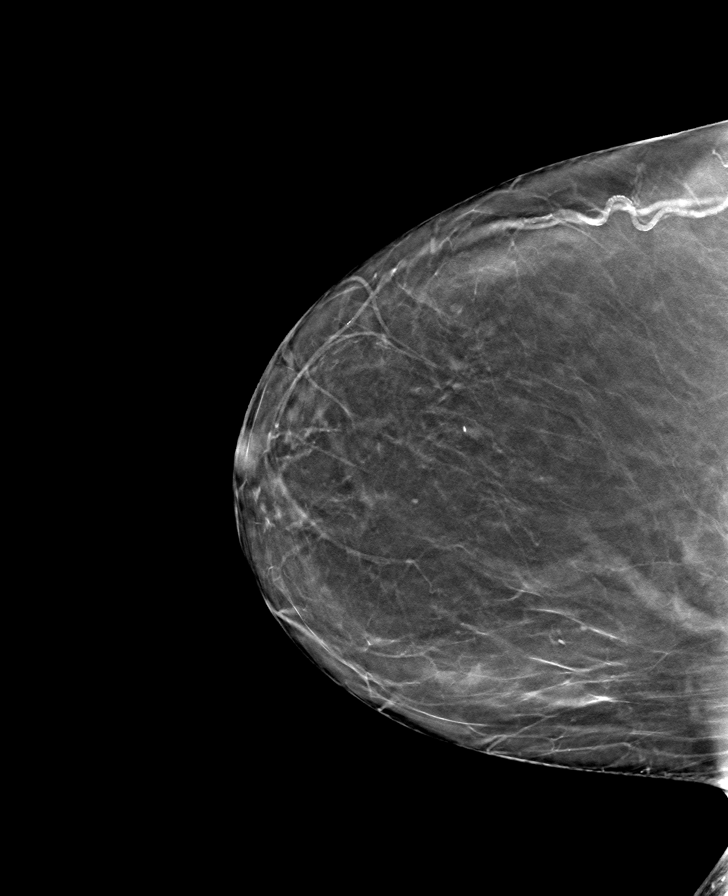

[L CC tomo · tomo slice 41/80.0]
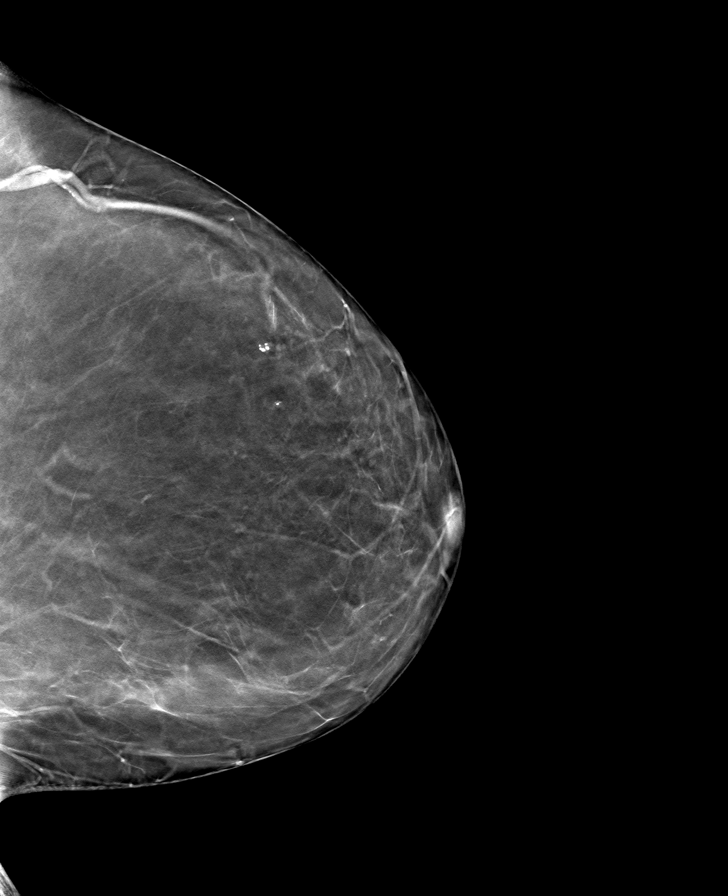

[L MLO tomo · tomo slice 43/84.0]
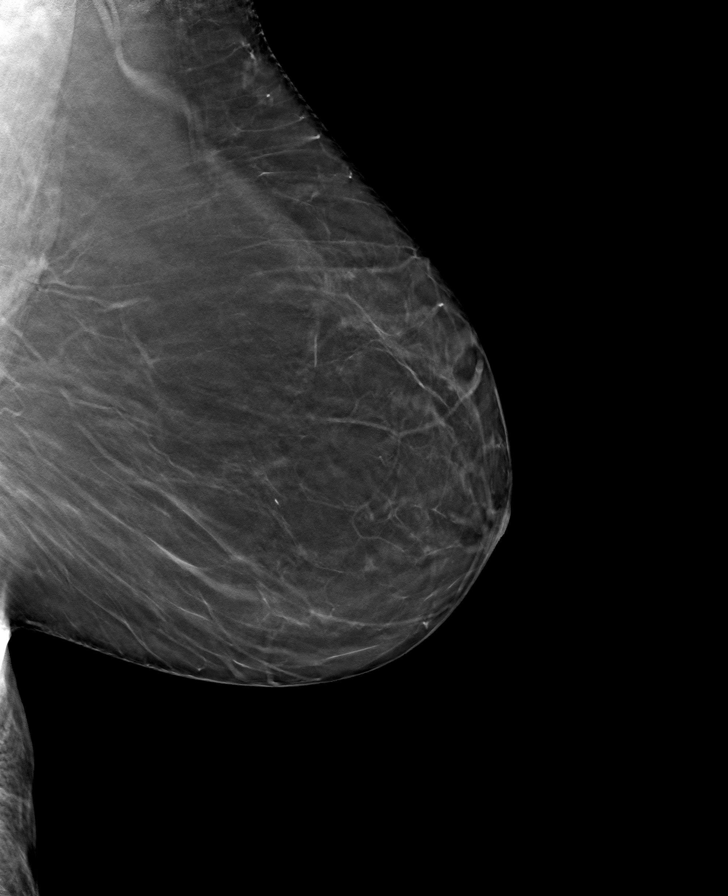

[8 of 24 positions shown; findings below may reference images not displayed]

FINDINGS: There are no findings suspicious for malignancy.
IMPRESSION: No mammographic evidence of malignancy. A result letter of this
screening mammogram will be mailed directly to the patient.

RECOMMENDATION:
Screening mammogram in one year. (Code:0E-3-N98)

BI-RADS CATEGORY  1: Negative.

## 2023-05-08 ENCOUNTER — Other Ambulatory Visit: Payer: Self-pay | Admitting: Internal Medicine

## 2023-05-08 DIAGNOSIS — R0602 Shortness of breath: Secondary | ICD-10-CM

## 2023-05-08 DIAGNOSIS — I2089 Other forms of angina pectoris: Secondary | ICD-10-CM

## 2023-05-27 ENCOUNTER — Telehealth (HOSPITAL_COMMUNITY): Payer: Self-pay | Admitting: Emergency Medicine

## 2023-05-27 DIAGNOSIS — R079 Chest pain, unspecified: Secondary | ICD-10-CM

## 2023-05-27 MED ORDER — IVABRADINE HCL 5 MG PO TABS: 10.0000 mg | ORAL_TABLET | Freq: Once | ORAL | 0 refills | Status: AC

## 2023-05-27 MED ORDER — METOPROLOL TARTRATE 100 MG PO TABS: 100.0000 mg | ORAL_TABLET | Freq: Once | ORAL | 0 refills | Status: AC

## 2023-05-27 NOTE — Telephone Encounter (Signed)
Attempted to call patient regarding upcoming cardiac CT appointment. Left message on voicemail with name and callback number Lavonia Eager RN Navigator [facility] [phone] Office [phone] Cell  100mg metoprolol + 10mg ivabradine sent to pharm on file  

## 2023-05-28 ENCOUNTER — Ambulatory Visit
Admission: RE | Admit: 2023-05-28 | Discharge: 2023-05-28 | Disposition: A | Discharge status: Home / Self Care | Payer: Medicare Other | Source: Ambulatory Visit | Attending: Internal Medicine | Admitting: Internal Medicine

## 2023-05-28 DIAGNOSIS — I2089 Other forms of angina pectoris: Secondary | ICD-10-CM | POA: Diagnosis present

## 2023-05-28 DIAGNOSIS — R0602 Shortness of breath: Secondary | ICD-10-CM | POA: Insufficient documentation

## 2023-05-28 MED ORDER — NITROGLYCERIN 0.4 MG SL SUBL
0.8000 mg | SUBLINGUAL_TABLET | Freq: Once | SUBLINGUAL | Status: AC
  Administered 2023-05-28: 0.8 mg via SUBLINGUAL

## 2023-05-28 MED ORDER — IOHEXOL 350 MG/ML SOLN
100.0000 mL | Freq: Once | INTRAVENOUS | Status: AC | PRN
  Administered 2023-05-28: 100 mL via INTRAVENOUS

## 2023-05-28 NOTE — Progress Notes (Signed)
Patient tolerated CT well. Drank water after. Vital signs stable encourage to drink water throughout day.Reasons explained and verbalized understanding. Ambulated steady gait.  

## 2023-06-03 ENCOUNTER — Ambulatory Visit: Admission: RE | Admit: 2023-06-03 | Payer: Medicare Other | Source: Ambulatory Visit

## 2023-12-31 ENCOUNTER — Other Ambulatory Visit: Payer: Self-pay | Admitting: Internal Medicine

## 2023-12-31 DIAGNOSIS — Z1231 Encounter for screening mammogram for malignant neoplasm of breast: Secondary | ICD-10-CM

## 2024-01-19 ENCOUNTER — Ambulatory Visit
Admission: RE | Admit: 2024-01-19 | Discharge: 2024-01-19 | Disposition: A | Discharge status: Home / Self Care | Payer: Medicare Other | Source: Ambulatory Visit | Attending: Internal Medicine | Admitting: Internal Medicine

## 2024-01-19 DIAGNOSIS — Z1231 Encounter for screening mammogram for malignant neoplasm of breast: Secondary | ICD-10-CM | POA: Insufficient documentation

## 2024-12-13 ENCOUNTER — Other Ambulatory Visit: Payer: Self-pay | Admitting: Internal Medicine

## 2024-12-13 DIAGNOSIS — Z1231 Encounter for screening mammogram for malignant neoplasm of breast: Secondary | ICD-10-CM

## 2025-01-09 ENCOUNTER — Ambulatory Visit: Payer: Self-pay

## 2025-01-19 ENCOUNTER — Ambulatory Visit
Admission: RE | Admit: 2025-01-19 | Discharge: 2025-01-19 | Disposition: A | Source: Ambulatory Visit | Attending: Internal Medicine | Admitting: Internal Medicine

## 2025-01-19 DIAGNOSIS — Z1231 Encounter for screening mammogram for malignant neoplasm of breast: Secondary | ICD-10-CM

## 2025-02-13 ENCOUNTER — Ambulatory Visit: Payer: Self-pay
